# Patient Record
Sex: Female | Born: 1980
Health system: Southern US, Community
[De-identification: ages and names within clinical notes are randomized; demographics above are authoritative.]

## PROBLEM LIST (undated history)

## (undated) DIAGNOSIS — IMO0002 Reserved for concepts with insufficient information to code with codable children: Secondary | ICD-10-CM

## (undated) DIAGNOSIS — R51 Headache: Secondary | ICD-10-CM

## (undated) DIAGNOSIS — Z8619 Personal history of other infectious and parasitic diseases: Secondary | ICD-10-CM

## (undated) DIAGNOSIS — R519 Headache, unspecified: Secondary | ICD-10-CM

## (undated) DIAGNOSIS — K4090 Unilateral inguinal hernia, without obstruction or gangrene, not specified as recurrent: Secondary | ICD-10-CM

## (undated) DIAGNOSIS — N390 Urinary tract infection, site not specified: Secondary | ICD-10-CM

## (undated) HISTORY — DX: Headache, unspecified: R51.9

## (undated) HISTORY — PX: CERVICAL FUSION: SHX112

## (undated) HISTORY — DX: Personal history of other infectious and parasitic diseases: Z86.19

## (undated) HISTORY — DX: Headache: R51

---
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
## 2001-01-19 ENCOUNTER — Other Ambulatory Visit: Admission: RE | Admit: 2001-01-19 | Discharge: 2001-01-19 | Payer: Self-pay | Admitting: Otolaryngology

## 2001-05-20 ENCOUNTER — Other Ambulatory Visit: Admission: RE | Admit: 2001-05-20 | Discharge: 2001-05-20 | Payer: Self-pay | Admitting: *Deleted

## 2001-06-23 ENCOUNTER — Emergency Department (HOSPITAL_COMMUNITY): Admission: EM | Admit: 2001-06-23 | Discharge: 2001-06-23 | Payer: Self-pay | Admitting: Emergency Medicine

## 2002-12-07 ENCOUNTER — Other Ambulatory Visit: Admission: RE | Admit: 2002-12-07 | Discharge: 2002-12-07 | Payer: Self-pay | Admitting: *Deleted

## 2003-04-07 ENCOUNTER — Inpatient Hospital Stay (HOSPITAL_COMMUNITY): Admission: AD | Admit: 2003-04-07 | Discharge: 2003-04-07 | Payer: Self-pay | Admitting: Obstetrics and Gynecology

## 2003-06-14 ENCOUNTER — Ambulatory Visit (HOSPITAL_COMMUNITY): Admission: RE | Admit: 2003-06-14 | Discharge: 2003-06-14 | Payer: Self-pay | Admitting: Obstetrics and Gynecology

## 2003-06-29 ENCOUNTER — Inpatient Hospital Stay (HOSPITAL_COMMUNITY): Admission: AD | Admit: 2003-06-29 | Discharge: 2003-06-30 | Payer: Self-pay | Admitting: Obstetrics and Gynecology

## 2003-07-13 ENCOUNTER — Inpatient Hospital Stay (HOSPITAL_COMMUNITY): Admission: AD | Admit: 2003-07-13 | Discharge: 2003-07-15 | Payer: Self-pay | Admitting: Obstetrics and Gynecology

## 2004-10-30 ENCOUNTER — Ambulatory Visit (HOSPITAL_COMMUNITY): Admission: RE | Admit: 2004-10-30 | Discharge: 2004-10-30 | Payer: Self-pay | Admitting: *Deleted

## 2005-06-26 ENCOUNTER — Other Ambulatory Visit: Admission: RE | Admit: 2005-06-26 | Discharge: 2005-06-26 | Payer: Self-pay | Admitting: Obstetrics and Gynecology

## 2008-04-14 HISTORY — PX: OTHER SURGICAL HISTORY: SHX169

## 2009-12-21 ENCOUNTER — Encounter: Admission: RE | Admit: 2009-12-21 | Discharge: 2009-12-21 | Payer: Self-pay | Admitting: *Deleted

## 2010-04-22 ENCOUNTER — Encounter
Admission: RE | Admit: 2010-04-22 | Discharge: 2010-04-22 | Payer: Self-pay | Source: Home / Self Care | Attending: Otolaryngology | Admitting: Otolaryngology

## 2010-08-30 NOTE — Op Note (Signed)
Ruth Blevins, Ruth Blevins            ACCOUNT NO.:  [id number]   MEDICAL RECORD NO.:  [id number]          PATIENT TYPE:  OIB   LOCATION:  NA                           FACILITY:  [facility]   PHYSICIAN:  Vikki Ports, MDDATE OF BIRTH:  [birth year]   DATE OF PROCEDURE:  09/30/2004  DATE OF DISCHARGE:                                 OPERATIVE REPORT   PREOPERATIVE DIAGNOSIS:  Right inguinal hernia.   POSTOPERATIVE DIAGNOSIS:  Right inguinal hernia.   PROCEDURE:  Laparoscopic converted to open right inguinal hernia repair with  mesh.   SURGEON:  Vikki Ports, M.D.   ANESTHESIA:  General.   DESCRIPTION OF PROCEDURE:  The patient was taken to the operating room and  placed in the supine position.  After adequate general anesthesia was  induced using the endotracheal tube, the abdomen was prepped and draped in a  normal sterile fashion.  Using a transverse infra-umbilical incision, I  dissected down onto the right rectus fascia.  A small transverse incision  was made in the anterior fascia.  The muscle fibers were retracted  laterally.  A Kelly clamp was then placed in the pre-peritoneal space down  towards the pubic tubercle.  The dissecting balloon was placed in this space  and inflated.  Of note, the balloon appeared to deploy more on the left than  on the right.  It appeared that there may have been a tear in the peritoneum  during the balloon dissection.  This balloon was removed and the operating  balloon was placed.  CO2 was insufflated.  I was getting obvious  pneumoperitoneum, verifying the presence of a tear in the peritoneum, and I  opted to open.  The anterior fascia was closed with a figure-of-eight Vicryl  suture and the skin was closed with staples.   I then made an oblique incision in the right inguinal area, dissected down  the external oblique fascia which was opened along with its fibers down to  the external ring.  The round ligament was identified and an  indirect hernia  sac was identified.  This was taken up to the internal ring and ligated and  sent for pathologic evaluation.  There was no direct defect; however, a  piece of Prolene mesh was placed over the floor of Hesselbach's triangle,  covering both the external and internal rings, and tacked in place using a  running #2-0 Prolene suture.  The external oblique fascia was then closed  with a running #3-0 Vicryl suture.  The tissues were injected with Marcaine.  The skin was closed with subcuticular #3-0 Monocryl.  Steri-Strips and a  sterile dressing were applied.   The patient tolerated the procedure well and went to the PACU in good  condition.       KRH/MEDQ  D:  10/30/2004  T:  10/30/2004  Job:  [id number]

## 2012-04-01 LAB — OB RESULTS CONSOLE HEPATITIS B SURFACE ANTIGEN: Hepatitis B Surface Ag: NEGATIVE

## 2012-04-01 LAB — OB RESULTS CONSOLE GC/CHLAMYDIA
Chlamydia: NEGATIVE
Gonorrhea: NEGATIVE

## 2012-04-01 LAB — OB RESULTS CONSOLE ABO/RH: RH Type: POSITIVE

## 2012-04-01 LAB — OB RESULTS CONSOLE ANTIBODY SCREEN: Antibody Screen: NEGATIVE

## 2012-04-01 LAB — OB RESULTS CONSOLE RUBELLA ANTIBODY, IGM: Rubella: IMMUNE

## 2012-04-01 LAB — OB RESULTS CONSOLE RPR: RPR: NONREACTIVE

## 2012-04-01 LAB — OB RESULTS CONSOLE HIV ANTIBODY (ROUTINE TESTING): HIV: NONREACTIVE

## 2012-04-14 NOTE — L&D Delivery Note (Signed)
Delivery Note Pt initially progressed quickly after SROM, then required pitocin augmentation.  She then progressed to complete and pushed well.  At 3:04 AM a viable female was delivered via Vaginal, Spontaneous Delivery (Presentation: ; Occiput Posterior).  APGAR: 9, 9; weight pending.   Placenta status: Intact, Spontaneous.  Cord: 3 vessels with the following complications: None.  Anesthesia: Epidural Local, epidural Episiotomy: None Lacerations: 1st degree;Perineal Suture Repair: 3.0 vicryl rapide Est. Blood Loss (mL): 300  Mom to postpartum.  Baby to stay with mom.  Discussed circumcision procedure and risks, consent signed.    Graeden Bitner D 10/10/2012, 3:18 PM

## 2012-09-22 LAB — OB RESULTS CONSOLE GBS: GBS: NEGATIVE

## 2012-10-09 ENCOUNTER — Encounter (HOSPITAL_COMMUNITY): Payer: Self-pay

## 2012-10-09 ENCOUNTER — Inpatient Hospital Stay (HOSPITAL_COMMUNITY)
Admission: AD | Admit: 2012-10-09 | Discharge: 2012-10-11 | DRG: 373 | Disposition: A | Discharge status: Home / Self Care | Payer: BC Managed Care – PPO | Source: Ambulatory Visit | Attending: Obstetrics and Gynecology | Admitting: Obstetrics and Gynecology

## 2012-10-09 DIAGNOSIS — Z3490 Encounter for supervision of normal pregnancy, unspecified, unspecified trimester: Secondary | ICD-10-CM

## 2012-10-09 HISTORY — DX: Urinary tract infection, site not specified: N39.0

## 2012-10-09 HISTORY — DX: Reserved for concepts with insufficient information to code with codable children: IMO0002

## 2012-10-09 HISTORY — DX: Unilateral inguinal hernia, without obstruction or gangrene, not specified as recurrent: K40.90

## 2012-10-09 NOTE — MAU Note (Signed)
Patient is in with c/o ctx q34m for a few hours, was 1-2cm on Wednesday. Denies vaginal bleeding or lof. She reports good fetal movement.

## 2012-10-09 NOTE — Progress Notes (Signed)
Dr Jackelyn Knife notified of patient presenting c/o of contractions q73m, tracing, ctx pattern, history of delivery at 37 weeks with first child (9 years ago) and sve result. Order that it is okay for patient to ambulate for an hour, recheck and if no cervical change. Patient may be discharged home. Patient may have one tablet of 5mg  ambien before discharge, if she desires.

## 2012-10-10 ENCOUNTER — Encounter (HOSPITAL_COMMUNITY): Payer: Self-pay | Admitting: Obstetrics

## 2012-10-10 ENCOUNTER — Encounter (HOSPITAL_COMMUNITY): Payer: Self-pay | Admitting: Anesthesiology

## 2012-10-10 ENCOUNTER — Inpatient Hospital Stay (HOSPITAL_COMMUNITY): Payer: BC Managed Care – PPO | Admitting: Anesthesiology

## 2012-10-10 DIAGNOSIS — Z3490 Encounter for supervision of normal pregnancy, unspecified, unspecified trimester: Secondary | ICD-10-CM

## 2012-10-10 LAB — CBC
HCT: 31.3 % — ABNORMAL LOW (ref 36.0–46.0)
HCT: 26.5 % — ABNORMAL LOW (ref 36.0–46.0)
Hemoglobin: 10.4 g/dL — ABNORMAL LOW (ref 12.0–15.0)
Hemoglobin: 8.9 g/dL — ABNORMAL LOW (ref 12.0–15.0)
MCH: 27.9 pg (ref 26.0–34.0)
MCH: 28.2 pg (ref 26.0–34.0)
MCHC: 33.2 g/dL (ref 30.0–36.0)
MCHC: 33.6 g/dL (ref 30.0–36.0)
MCV: 83.9 fL (ref 78.0–100.0)
MCV: 83.9 fL (ref 78.0–100.0)
Platelets: 126 10*3/uL — ABNORMAL LOW (ref 150–400)
Platelets: 90 10*3/uL — ABNORMAL LOW (ref 150–400)
RBC: 3.73 MIL/uL — ABNORMAL LOW (ref 3.87–5.11)
RBC: 3.16 MIL/uL — ABNORMAL LOW (ref 3.87–5.11)
RDW: 13.5 % (ref 11.5–15.5)
RDW: 13.5 % (ref 11.5–15.5)
WBC: 16.7 10*3/uL — ABNORMAL HIGH (ref 4.0–10.5)
WBC: 18.5 10*3/uL — ABNORMAL HIGH (ref 4.0–10.5)

## 2012-10-10 LAB — RPR: RPR Ser Ql: NONREACTIVE

## 2012-10-10 MED ORDER — BUTORPHANOL TARTRATE 1 MG/ML IJ SOLN
1.0000 mg | Freq: Once | INTRAMUSCULAR | Status: AC
  Administered 2012-10-10: 1 mg via INTRAMUSCULAR
  Filled 2012-10-10: qty 1

## 2012-10-10 MED ORDER — SENNOSIDES-DOCUSATE SODIUM 8.6-50 MG PO TABS
2.0000 | ORAL_TABLET | Freq: Every day | ORAL | Status: DC
  Administered 2012-10-10: 2 via ORAL

## 2012-10-10 MED ORDER — FENTANYL 2.5 MCG/ML BUPIVACAINE 1/10 % EPIDURAL INFUSION (WH - ANES)
14.0000 mL/h | INTRAMUSCULAR | Status: DC | PRN
  Administered 2012-10-10: 14 mL/h via EPIDURAL
  Filled 2012-10-10 (×2): qty 125

## 2012-10-10 MED ORDER — LACTATED RINGERS IV SOLN
500.0000 mL | Freq: Once | INTRAVENOUS | Status: AC
  Administered 2012-10-10: 500 mL via INTRAVENOUS

## 2012-10-10 MED ORDER — ZOLPIDEM TARTRATE 5 MG PO TABS: 5.0000 mg | ORAL_TABLET | Freq: Every evening | ORAL | Status: DC | PRN

## 2012-10-10 MED ORDER — PHENYLEPHRINE 40 MCG/ML (10ML) SYRINGE FOR IV PUSH (FOR BLOOD PRESSURE SUPPORT): 80.0000 ug | PREFILLED_SYRINGE | INTRAVENOUS | Status: DC | PRN

## 2012-10-10 MED ORDER — LIDOCAINE HCL (PF) 1 % IJ SOLN
INTRAMUSCULAR | Status: DC | PRN
  Administered 2012-10-10: 3 mL
  Administered 2012-10-10: 4 mL

## 2012-10-10 MED ORDER — IBUPROFEN 600 MG PO TABS
600.0000 mg | ORAL_TABLET | Freq: Four times a day (QID) | ORAL | Status: DC | PRN
  Filled 2012-10-10: qty 1

## 2012-10-10 MED ORDER — WITCH HAZEL-GLYCERIN EX PADS: 1.0000 "application " | MEDICATED_PAD | CUTANEOUS | Status: DC | PRN

## 2012-10-10 MED ORDER — DIPHENHYDRAMINE HCL 25 MG PO CAPS: 25.0000 mg | ORAL_CAPSULE | Freq: Four times a day (QID) | ORAL | Status: DC | PRN

## 2012-10-10 MED ORDER — METHYLERGONOVINE MALEATE 0.2 MG PO TABS: 0.2000 mg | ORAL_TABLET | ORAL | Status: DC | PRN

## 2012-10-10 MED ORDER — MAGNESIUM HYDROXIDE 400 MG/5ML PO SUSP: 30.0000 mL | ORAL | Status: DC | PRN

## 2012-10-10 MED ORDER — OXYTOCIN BOLUS FROM INFUSION
500.0000 mL | INTRAVENOUS | Status: DC
  Administered 2012-10-10: 500 mL via INTRAVENOUS

## 2012-10-10 MED ORDER — OXYTOCIN 40 UNITS IN LACTATED RINGERS INFUSION - SIMPLE MED
62.5000 mL/h | INTRAVENOUS | Status: DC
  Filled 2012-10-10: qty 1000

## 2012-10-10 MED ORDER — ONDANSETRON HCL 4 MG PO TABS: 4.0000 mg | ORAL_TABLET | ORAL | Status: DC | PRN

## 2012-10-10 MED ORDER — FLEET ENEMA 7-19 GM/118ML RE ENEM: 1.0000 | ENEMA | RECTAL | Status: DC | PRN

## 2012-10-10 MED ORDER — CITRIC ACID-SODIUM CITRATE 334-500 MG/5ML PO SOLN: 30.0000 mL | ORAL | Status: DC | PRN

## 2012-10-10 MED ORDER — PHENYLEPHRINE 40 MCG/ML (10ML) SYRINGE FOR IV PUSH (FOR BLOOD PRESSURE SUPPORT)
80.0000 ug | PREFILLED_SYRINGE | INTRAVENOUS | Status: DC | PRN
  Filled 2012-10-10: qty 5

## 2012-10-10 MED ORDER — EPHEDRINE 5 MG/ML INJ
10.0000 mg | INTRAVENOUS | Status: DC | PRN
  Filled 2012-10-10: qty 4

## 2012-10-10 MED ORDER — MEASLES, MUMPS & RUBELLA VAC ~~LOC~~ INJ: 0.5000 mL | INJECTION | Freq: Once | SUBCUTANEOUS | Status: DC

## 2012-10-10 MED ORDER — EPHEDRINE 5 MG/ML INJ: 10.0000 mg | INTRAVENOUS | Status: DC | PRN

## 2012-10-10 MED ORDER — IBUPROFEN 600 MG PO TABS
600.0000 mg | ORAL_TABLET | Freq: Four times a day (QID) | ORAL | Status: DC
  Administered 2012-10-10 – 2012-10-11 (×4): 600 mg via ORAL
  Filled 2012-10-10 (×4): qty 1

## 2012-10-10 MED ORDER — LACTATED RINGERS IV SOLN
INTRAVENOUS | Status: DC
  Administered 2012-10-10: 07:00:00 via INTRAVENOUS

## 2012-10-10 MED ORDER — ONDANSETRON HCL 4 MG/2ML IJ SOLN: 4.0000 mg | Freq: Four times a day (QID) | INTRAMUSCULAR | Status: DC | PRN

## 2012-10-10 MED ORDER — DIPHENHYDRAMINE HCL 50 MG/ML IJ SOLN: 12.5000 mg | INTRAMUSCULAR | Status: DC | PRN

## 2012-10-10 MED ORDER — OXYCODONE-ACETAMINOPHEN 5-325 MG PO TABS: 1.0000 | ORAL_TABLET | ORAL | Status: DC | PRN

## 2012-10-10 MED ORDER — METHYLERGONOVINE MALEATE 0.2 MG/ML IJ SOLN: 0.2000 mg | INTRAMUSCULAR | Status: DC | PRN

## 2012-10-10 MED ORDER — TERBUTALINE SULFATE 1 MG/ML IJ SOLN: 0.2500 mg | Freq: Once | INTRAMUSCULAR | Status: DC | PRN

## 2012-10-10 MED ORDER — LIDOCAINE HCL (PF) 1 % IJ SOLN
30.0000 mL | INTRAMUSCULAR | Status: DC | PRN
  Administered 2012-10-10: 30 mL via SUBCUTANEOUS
  Filled 2012-10-10: qty 30

## 2012-10-10 MED ORDER — ONDANSETRON HCL 4 MG/2ML IJ SOLN: 4.0000 mg | INTRAMUSCULAR | Status: DC | PRN

## 2012-10-10 MED ORDER — SIMETHICONE 80 MG PO CHEW: 80.0000 mg | CHEWABLE_TABLET | ORAL | Status: DC | PRN

## 2012-10-10 MED ORDER — DIBUCAINE 1 % RE OINT
1.0000 "application " | TOPICAL_OINTMENT | RECTAL | Status: DC | PRN
  Filled 2012-10-10: qty 28

## 2012-10-10 MED ORDER — ALUM & MAG HYDROXIDE-SIMETH 200-200-20 MG/5ML PO SUSP: 30.0000 mL | Freq: Once | ORAL | Status: DC

## 2012-10-10 MED ORDER — PRENATAL MULTIVITAMIN CH: 1.0000 | ORAL_TABLET | Freq: Every day | ORAL | Status: DC

## 2012-10-10 MED ORDER — BENZOCAINE-MENTHOL 20-0.5 % EX AERO
1.0000 "application " | INHALATION_SPRAY | CUTANEOUS | Status: DC | PRN
  Filled 2012-10-10 (×2): qty 56

## 2012-10-10 MED ORDER — LANOLIN HYDROUS EX OINT: TOPICAL_OINTMENT | CUTANEOUS | Status: DC | PRN

## 2012-10-10 MED ORDER — ACETAMINOPHEN 325 MG PO TABS: 650.0000 mg | ORAL_TABLET | ORAL | Status: DC | PRN

## 2012-10-10 MED ORDER — OXYCODONE-ACETAMINOPHEN 5-325 MG PO TABS
1.0000 | ORAL_TABLET | ORAL | Status: DC | PRN
  Administered 2012-10-10 – 2012-10-11 (×3): 1 via ORAL
  Filled 2012-10-10 (×3): qty 1

## 2012-10-10 MED ORDER — TETANUS-DIPHTH-ACELL PERTUSSIS 5-2.5-18.5 LF-MCG/0.5 IM SUSP
0.5000 mL | Freq: Once | INTRAMUSCULAR | Status: DC
  Filled 2012-10-10: qty 0.5

## 2012-10-10 MED ORDER — FENTANYL 2.5 MCG/ML BUPIVACAINE 1/10 % EPIDURAL INFUSION (WH - ANES)
INTRAMUSCULAR | Status: DC | PRN
  Administered 2012-10-10: 14 mL/h via EPIDURAL

## 2012-10-10 MED ORDER — OXYTOCIN 40 UNITS IN LACTATED RINGERS INFUSION - SIMPLE MED
1.0000 m[IU]/min | INTRAVENOUS | Status: DC
  Administered 2012-10-10: 1 m[IU]/min via INTRAVENOUS
  Filled 2012-10-10: qty 1000

## 2012-10-10 MED ORDER — LACTATED RINGERS IV SOLN: 500.0000 mL | INTRAVENOUS | Status: DC | PRN

## 2012-10-10 NOTE — H&P (Signed)
Ruth Blevins is a 32 y.o. female, G3 P1011, EGA 37+ weeks with EDC 7-17 presenting for eval of ctx.  Over several hours in MAU with reg ctx, VE changed from 2 to 4 cm dilated.  Pt admitted and has received an epidural.  Prenatal care complicated by preterm ctx and dilation with first pregnancy, so received weekly 17-P with this pregnancy with Dr. Senaida Ores as primary doxctor.  No other significant complications, see prenatal records for complete history.  Maternal Medical History:  Reason for admission: Contractions.   Contractions: Frequency: regular.   Perceived severity is strong.    Fetal activity: Perceived fetal activity is normal.      OB History   Grav Para Term Preterm Abortions TAB SAB Ect Mult Living   3 1 1  1  1   1     SVD at 37 weeks, preterm ctx at 26 weeks  Past Medical History  Diagnosis Date  . Inguinal hernia   . UTI (lower urinary tract infection)   . Degenerative disc disease    Past Surgical History  Procedure Laterality Date  . Cervical fusion     Family History: family history is not on file. Social History:  reports that she has never smoked. She does not have any smokeless tobacco history on file. She reports that she does not drink alcohol or use illicit drugs.   Prenatal Transfer Tool  Maternal Diabetes: No Genetic Screening: Normal Maternal Ultrasounds/Referrals: Normal Fetal Ultrasounds or other Referrals:  None Maternal Substance Abuse:  No Significant Maternal Medications:  Meds include: Progesterone Significant Maternal Lab Results:  Lab values include: Group B Strep negative Other Comments:  None  Review of Systems  Respiratory: Negative.   Cardiovascular: Negative.    SROM during exam Dilation: 4 Effacement (%): 80 Station: -2 Exam by:: dr Jackelyn Knife Blood pressure 82/64, pulse 125, temperature 98.1 F (36.7 C), temperature source Oral, resp. rate 18, height 5\' 4"  (1.626 m), weight 73.483 kg (162 lb), SpO2 100.00%. Maternal  Exam:  Uterine Assessment: Contraction strength is moderate.  Contraction frequency is regular.   Abdomen: Patient reports no abdominal tenderness. Estimated fetal weight is 7 lbs.   Fetal presentation: vertex  Introitus: Normal vulva. Normal vagina.  Amniotic fluid character: clear.  Pelvis: adequate for delivery.   Cervix: Cervix evaluated by digital exam.     Fetal Exam Fetal Monitor Review: Mode: ultrasound.   Baseline rate: 130.  Variability: moderate (6-25 bpm).   Pattern: accelerations present and no decelerations.    Fetal State Assessment: Category I - tracings are normal.     Physical Exam  Constitutional: She appears well-developed and well-nourished.  Cardiovascular: Normal rate, regular rhythm and normal heart sounds.   No murmur heard. Respiratory: Effort normal and breath sounds normal. No respiratory distress.  GI: Soft.  Gravid     Prenatal labs: ABO, Rh:  A pos Antibody:  neg Rubella:  Imm RPR:   NR HBsAg:  Neg  HIV:   Neg GBS:   Neg GCT:  108  Assessment/Plan: IUP at 37+ weeks in latent labor, had SROM during my exam.  Will monitor progress, anticipate SVD.   Fabiola Mudgett D 10/10/2012, 8:42 AM

## 2012-10-10 NOTE — Progress Notes (Signed)
Dr Jackelyn Knife notified of patient, becoming very uncomfortable with each contraction, cervical change, tracing and ctx pattern. Order to continue efm and recheck cervix. Administer 1mg  stadol im. Call back with update.

## 2012-10-10 NOTE — Progress Notes (Signed)
Dr Jackelyn Knife of current cervical exam. Will admit patient. Patient may have epidural.

## 2012-10-10 NOTE — Anesthesia Preprocedure Evaluation (Signed)
Anesthesia Evaluation  Patient identified by MRN, date of birth, ID band Patient awake    Reviewed: Allergy & Precautions, H&P , NPO status , Patient's Chart, lab work & pertinent test results  Airway Mallampati: II TM Distance: >3 FB Neck ROM: Limited    Dental  (+) Dental Advisory Given   Pulmonary neg pulmonary ROS,  breath sounds clear to auscultation        Cardiovascular negative cardio ROS  Rhythm:Regular     Neuro/Psych negative neurological ROS  negative psych ROS   GI/Hepatic negative GI ROS, Neg liver ROS,   Endo/Other  negative endocrine ROS  Renal/GU negative Renal ROS     Musculoskeletal negative musculoskeletal ROS (+)   Abdominal   Peds  Hematology negative hematology ROS (+)   Anesthesia Other Findings   Reproductive/Obstetrics (+) Pregnancy                           Anesthesia Physical Anesthesia Plan  ASA: II  Anesthesia Plan: Epidural   Post-op Pain Management:    Induction:   Airway Management Planned:   Additional Equipment:   Intra-op Plan:   Post-operative Plan:   Informed Consent: I have reviewed the patients History and Physical, chart, labs and discussed the procedure including the risks, benefits and alternatives for the proposed anesthesia with the patient or authorized representative who has indicated his/her understanding and acceptance.     Plan Discussed with:   Anesthesia Plan Comments:         Anesthesia Quick Evaluation

## 2012-10-10 NOTE — Progress Notes (Signed)
Pt states she feels that her water broke. No fluid visible on examination. Fern slide negative.

## 2012-10-10 NOTE — Anesthesia Procedure Notes (Signed)
Epidural Patient location during procedure: OB Start time: 10/10/2012 4:00 AM End time: 10/10/2012 4:15 AM  Staffing Anesthesiologist: Lewie Loron R Performed by: anesthesiologist   Preanesthetic Checklist Completed: patient identified, pre-op evaluation, timeout performed, IV checked, risks and benefits discussed and monitors and equipment checked  Epidural Patient position: sitting Prep: site prepped and draped and DuraPrep Patient monitoring: heart rate, continuous pulse ox and blood pressure Approach: midline Injection technique: LOR air and LOR saline  Needle:  Needle type: Tuohy  Needle gauge: 17 G Needle length: 9 cm Needle insertion depth: 6 cm Catheter type: closed end flexible Catheter size: 19 Gauge Catheter at skin depth: 12 cm Test dose: negative  Assessment Sensory level: T8 Events: blood not aspirated, injection not painful, no injection resistance, negative IV test and no paresthesia  Additional Notes Reason for block:procedure for pain

## 2012-10-11 LAB — CBC
HCT: 27.3 % — ABNORMAL LOW (ref 36.0–46.0)
Hemoglobin: 8.8 g/dL — ABNORMAL LOW (ref 12.0–15.0)
MCH: 27.2 pg (ref 26.0–34.0)
MCHC: 32.2 g/dL (ref 30.0–36.0)
MCV: 84.3 fL (ref 78.0–100.0)
Platelets: 110 10*3/uL — ABNORMAL LOW (ref 150–400)
RBC: 3.24 MIL/uL — ABNORMAL LOW (ref 3.87–5.11)
RDW: 13.6 % (ref 11.5–15.5)
WBC: 19.1 10*3/uL — ABNORMAL HIGH (ref 4.0–10.5)

## 2012-10-11 NOTE — Anesthesia Postprocedure Evaluation (Signed)
  Anesthesia Post-op Note  Patient: Ruth Blevins  Procedure(s) Performed: * No procedures listed *  Patient Location: PACU and Mother/Baby  Anesthesia Type:Epidural  Level of Consciousness: awake  Airway and Oxygen Therapy: Patient Spontanous Breathing  Post-op Pain: none  Post-op Assessment: Patient's Cardiovascular Status Stable, Respiratory Function Stable, Patent Airway, No signs of Nausea or vomiting, Adequate PO intake, Pain level controlled, No headache, No backache, No residual numbness and No residual motor weakness  Post-op Vital Signs: Reviewed and stable  Complications: No apparent anesthesia complications

## 2012-10-11 NOTE — Discharge Instructions (Signed)
As per discharge pamphlet °

## 2012-10-11 NOTE — Progress Notes (Signed)
PPD #1 No problems, wants to go home Afeb, VSS Fundus firm, NT at U-1 Continue routine postpartum care, will d/c home this pm if baby able to go

## 2012-10-11 NOTE — Discharge Summary (Signed)
Obstetric Discharge Summary Reason for Admission: onset of labor Prenatal Procedures: none Intrapartum Procedures: spontaneous vaginal delivery Postpartum Procedures: none Complications-Operative and Postpartum: 1st degree perineal laceration Hemoglobin  Date Value Range Status  10/10/2012 8.8* 12.0 - 15.0 g/dL Final     HCT  Date Value Range Status  10/10/2012 27.3* 36.0 - 46.0 % Final    Physical Exam:  General: alert Lochia: appropriate Uterine Fundus: firm  Discharge Diagnoses: Term Pregnancy-delivered  Discharge Information: Date: 10/11/2012 Activity: pelvic rest Diet: routine Medications: Ibuprofen Condition: stable Instructions: refer to practice specific booklet Discharge to: home Follow-up Information   Follow up with Dorsie Burich D, MD. Schedule an appointment as soon as possible for a visit in 6 weeks.   Contact information:   [address] [place] [address] [phone]       Newborn Data: Live born female  Birth Weight: 6 lb 5.4 oz (2875 g) APGAR: 9, 9  Home with mother.  Pranika Finks D 10/11/2012, 9:07 AM

## 2012-10-21 ENCOUNTER — Inpatient Hospital Stay (HOSPITAL_COMMUNITY): Admission: RE | Admit: 2012-10-21 | Payer: BC Managed Care – PPO | Source: Ambulatory Visit

## 2014-02-13 ENCOUNTER — Encounter (HOSPITAL_COMMUNITY): Payer: Self-pay | Admitting: Obstetrics

## 2016-02-24 DIAGNOSIS — M542 Cervicalgia: Secondary | ICD-10-CM | POA: Insufficient documentation

## 2016-02-24 DIAGNOSIS — G894 Chronic pain syndrome: Secondary | ICD-10-CM | POA: Insufficient documentation

## 2016-02-24 DIAGNOSIS — M7918 Myalgia, other site: Secondary | ICD-10-CM | POA: Insufficient documentation

## 2016-02-24 DIAGNOSIS — M961 Postlaminectomy syndrome, not elsewhere classified: Secondary | ICD-10-CM | POA: Insufficient documentation

## 2016-11-26 ENCOUNTER — Ambulatory Visit (INDEPENDENT_AMBULATORY_CARE_PROVIDER_SITE_OTHER): Payer: BLUE CROSS/BLUE SHIELD | Admitting: Family Medicine

## 2016-11-26 ENCOUNTER — Encounter: Payer: Self-pay | Admitting: Family Medicine

## 2016-11-26 VITALS — BP 100/60 | HR 73 | Temp 99.2°F | Ht 64.75 in | Wt 157.5 lb

## 2016-11-26 DIAGNOSIS — F419 Anxiety disorder, unspecified: Secondary | ICD-10-CM | POA: Diagnosis not present

## 2016-11-26 DIAGNOSIS — G8929 Other chronic pain: Secondary | ICD-10-CM

## 2016-11-26 DIAGNOSIS — F5104 Psychophysiologic insomnia: Secondary | ICD-10-CM

## 2016-11-26 DIAGNOSIS — M545 Low back pain, unspecified: Secondary | ICD-10-CM

## 2016-11-26 DIAGNOSIS — F329 Major depressive disorder, single episode, unspecified: Secondary | ICD-10-CM | POA: Diagnosis not present

## 2016-11-26 DIAGNOSIS — F32A Depression, unspecified: Secondary | ICD-10-CM

## 2016-11-26 MED ORDER — ESCITALOPRAM OXALATE 10 MG PO TABS: 10.0000 mg | ORAL_TABLET | Freq: Every day | ORAL | 5 refills | Status: DC

## 2016-11-26 NOTE — Patient Instructions (Signed)
Please follow up in 6-8 weeks If you are feeling worse or having more symptoms please let me know  Generalized Anxiety Disorder, Adult Generalized anxiety disorder (GAD) is a mental health disorder. People with this condition constantly worry about everyday events. Unlike normal anxiety, worry related to GAD is not triggered by a specific event. These worries also do not fade or get better with time. GAD interferes with life functions, including relationships, work, and school. GAD can vary from mild to severe. People with severe GAD can have intense waves of anxiety with physical symptoms (panic attacks). What are the causes? The exact cause of GAD is not known. What increases the risk? This condition is more likely to develop in:  Women.  People who have a family history of anxiety disorders.  People who are very shy.  People who experience very stressful life events, such as the death of a loved one.  People who have a very stressful family environment.  What are the signs or symptoms? People with GAD often worry excessively about many things in their lives, such as their health and family. They may also be overly concerned about:  Doing well at work.  Being on time.  Natural disasters.  Friendships.  Physical symptoms of GAD include:  Fatigue.  Muscle tension or having muscle twitches.  Trembling or feeling shaky.  Being easily startled.  Feeling like your heart is pounding or racing.  Feeling out of breath or like you cannot take a deep breath.  Having trouble falling asleep or staying asleep.  Sweating.  Nausea, diarrhea, or irritable bowel syndrome (IBS).  Headaches.  Trouble concentrating or remembering facts.  Restlessness.  Irritability.  How is this diagnosed? Your health care provider can diagnose GAD based on your symptoms and medical history. You will also have a physical exam. The health care provider will ask specific questions about your  symptoms, including how severe they are, when they started, and if they come and go. Your health care provider may ask you about your use of alcohol or drugs, including prescription medicines. Your health care provider may refer you to a mental health specialist for further evaluation. Your health care provider will do a thorough examination and may perform additional tests to rule out other possible causes of your symptoms. To be diagnosed with GAD, a person must have anxiety that:  Is out of his or her control.  Affects several different aspects of his or her life, such as work and relationships.  Causes distress that makes him or her unable to take part in normal activities.  Includes at least three physical symptoms of GAD, such as restlessness, fatigue, trouble concentrating, irritability, muscle tension, or sleep problems.  Before your health care provider can confirm a diagnosis of GAD, these symptoms must be present more days than they are not, and they must last for six months or longer. How is this treated? The following therapies are usually used to treat GAD:  Medicine. Antidepressant medicine is usually prescribed for long-term daily control. Antianxiety medicines may be added in severe cases, especially when panic attacks occur.  Talk therapy (psychotherapy). Certain types of talk therapy can be helpful in treating GAD by providing support, education, and guidance. Options include: ? Cognitive behavioral therapy (CBT). People learn coping skills and techniques to ease their anxiety. They learn to identify unrealistic or negative thoughts and behaviors and to replace them with positive ones. ? Acceptance and commitment therapy (ACT). This treatment teaches people how  to be mindful as a way to cope with unwanted thoughts and feelings. ? Biofeedback. This process trains you to manage your body's response (physiological response) through breathing techniques and relaxation methods. You  will work with a therapist while machines are used to monitor your physical symptoms.  Stress management techniques. These include yoga, meditation, and exercise.  A mental health specialist can help determine which treatment is best for you. Some people see improvement with one type of therapy. However, other people require a combination of therapies. Follow these instructions at home:  Take over-the-counter and prescription medicines only as told by your health care provider.  Try to maintain a normal routine.  Try to anticipate stressful situations and allow extra time to manage them.  Practice any stress management or self-calming techniques as taught by your health care provider.  Do not punish yourself for setbacks or for not making progress.  Try to recognize your accomplishments, even if they are small.  Keep all follow-up visits as told by your health care provider. This is important. Contact a health care provider if:  Your symptoms do not get better.  Your symptoms get worse.  You have signs of depression, such as: ? A persistently sad, cranky, or irritable mood. ? Loss of enjoyment in activities that used to bring you joy. ? Change in weight or eating. ? Changes in sleeping habits. ? Avoiding friends or family members. ? Loss of energy for normal tasks. ? Feelings of guilt or worthlessness. Get help right away if:  You have serious thoughts about hurting yourself or others. If you ever feel like you may hurt yourself or others, or have thoughts about taking your own life, get help right away. You can go to your nearest emergency department or call:  Your local emergency services (911 in the [place]  A suicide crisis helpline, such as the National Suicide Prevention Lifeline at 707-158-0866. This is open 24 hours a day.  Summary  Generalized anxiety disorder (GAD) is a mental health disorder that involves worry that is not triggered by a specific  event.  People with GAD often worry excessively about many things in their lives, such as their health and family.  GAD may cause physical symptoms such as restlessness, trouble concentrating, sleep problems, frequent sweating, nausea, diarrhea, headaches, and trembling or muscle twitching.  A mental health specialist can help determine which treatment is best for you. Some people see improvement with one type of therapy. However, other people require a combination of therapies. This information is not intended to replace advice given to you by your health care provider. Make sure you discuss any questions you have with your health care provider. Document Released: 07/26/2012 Document Revised: 02/19/2016 Document Reviewed: 02/19/2016 Elsevier Interactive Patient Education  [employer].
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
## 2016-11-27 LAB — COMPREHENSIVE METABOLIC PANEL WITH GFR
ALT: 15 U/L (ref 0–35)
AST: 18 U/L (ref 0–37)
Albumin: 4.3 g/dL (ref 3.5–5.2)
Alkaline Phosphatase: 41 U/L (ref 39–117)
BUN: 14 mg/dL (ref 6–23)
CO2: 27 meq/L (ref 19–32)
Calcium: 9.3 mg/dL (ref 8.4–10.5)
Chloride: 105 meq/L (ref 96–112)
Creatinine, Ser: 0.67 mg/dL (ref 0.40–1.20)
GFR: 106.64 mL/min
Glucose, Bld: 87 mg/dL (ref 70–99)
Potassium: 4.1 meq/L (ref 3.5–5.1)
Sodium: 139 meq/L (ref 135–145)
Total Bilirubin: 0.3 mg/dL (ref 0.2–1.2)
Total Protein: 6.8 g/dL (ref 6.0–8.3)

## 2016-11-27 LAB — CBC WITH DIFFERENTIAL/PLATELET
Basophils Absolute: 0.1 10*3/uL (ref 0.0–0.1)
Basophils Relative: 1.1 % (ref 0.0–3.0)
Eosinophils Absolute: 0.1 10*3/uL (ref 0.0–0.7)
Eosinophils Relative: 1.4 % (ref 0.0–5.0)
HCT: 40.4 % (ref 36.0–46.0)
Hemoglobin: 13.5 g/dL (ref 12.0–15.0)
Lymphocytes Relative: 33.4 % (ref 12.0–46.0)
Lymphs Abs: 3.1 10*3/uL (ref 0.7–4.0)
MCHC: 33.5 g/dL (ref 30.0–36.0)
MCV: 96.2 fl (ref 78.0–100.0)
Monocytes Absolute: 0.8 10*3/uL (ref 0.1–1.0)
Monocytes Relative: 8.1 % (ref 3.0–12.0)
Neutro Abs: 5.2 10*3/uL (ref 1.4–7.7)
Neutrophils Relative %: 56 % (ref 43.0–77.0)
Platelets: 200 10*3/uL (ref 150.0–400.0)
RBC: 4.2 Mil/uL (ref 3.87–5.11)
RDW: 12.4 % (ref 11.5–15.5)
WBC: 9.4 10*3/uL (ref 4.0–10.5)

## 2016-11-27 LAB — TSH: TSH: 3.79 u[IU]/mL (ref 0.35–4.50)

## 2016-11-27 LAB — VITAMIN D 25 HYDROXY (VIT D DEFICIENCY, FRACTURES): VITD: 24.91 ng/mL — ABNORMAL LOW (ref 30.00–100.00)

## 2016-11-28 ENCOUNTER — Encounter: Payer: Self-pay | Admitting: Family Medicine

## 2016-12-01 ENCOUNTER — Encounter: Payer: Self-pay | Admitting: Family Medicine

## 2016-12-03 ENCOUNTER — Telehealth: Payer: Self-pay | Admitting: Family Medicine

## 2016-12-03 NOTE — Telephone Encounter (Signed)
ROI faxed to [facility]

## 2016-12-16 DIAGNOSIS — M5431 Sciatica, right side: Secondary | ICD-10-CM | POA: Diagnosis not present

## 2016-12-16 DIAGNOSIS — M5106 Intervertebral disc disorders with myelopathy, lumbar region: Secondary | ICD-10-CM | POA: Diagnosis not present

## 2016-12-16 DIAGNOSIS — M461 Sacroiliitis, not elsewhere classified: Secondary | ICD-10-CM | POA: Diagnosis not present

## 2016-12-16 DIAGNOSIS — S344XXA Injury of lumbosacral plexus, initial encounter: Secondary | ICD-10-CM | POA: Diagnosis not present

## 2016-12-17 DIAGNOSIS — M5106 Intervertebral disc disorders with myelopathy, lumbar region: Secondary | ICD-10-CM | POA: Diagnosis not present

## 2016-12-17 DIAGNOSIS — M5431 Sciatica, right side: Secondary | ICD-10-CM | POA: Diagnosis not present

## 2016-12-17 DIAGNOSIS — S344XXA Injury of lumbosacral plexus, initial encounter: Secondary | ICD-10-CM | POA: Diagnosis not present

## 2016-12-17 DIAGNOSIS — M461 Sacroiliitis, not elsewhere classified: Secondary | ICD-10-CM | POA: Diagnosis not present

## 2016-12-18 DIAGNOSIS — M461 Sacroiliitis, not elsewhere classified: Secondary | ICD-10-CM | POA: Diagnosis not present

## 2016-12-18 DIAGNOSIS — M5106 Intervertebral disc disorders with myelopathy, lumbar region: Secondary | ICD-10-CM | POA: Diagnosis not present

## 2016-12-18 DIAGNOSIS — S344XXA Injury of lumbosacral plexus, initial encounter: Secondary | ICD-10-CM | POA: Diagnosis not present

## 2016-12-18 DIAGNOSIS — M5431 Sciatica, right side: Secondary | ICD-10-CM | POA: Diagnosis not present

## 2016-12-22 DIAGNOSIS — S344XXA Injury of lumbosacral plexus, initial encounter: Secondary | ICD-10-CM | POA: Diagnosis not present

## 2016-12-22 DIAGNOSIS — M461 Sacroiliitis, not elsewhere classified: Secondary | ICD-10-CM | POA: Diagnosis not present

## 2016-12-22 DIAGNOSIS — M5106 Intervertebral disc disorders with myelopathy, lumbar region: Secondary | ICD-10-CM | POA: Diagnosis not present

## 2016-12-22 DIAGNOSIS — M5431 Sciatica, right side: Secondary | ICD-10-CM | POA: Diagnosis not present

## 2016-12-23 DIAGNOSIS — Z1389 Encounter for screening for other disorder: Secondary | ICD-10-CM | POA: Diagnosis not present

## 2016-12-23 DIAGNOSIS — Z124 Encounter for screening for malignant neoplasm of cervix: Secondary | ICD-10-CM | POA: Diagnosis not present

## 2016-12-23 DIAGNOSIS — M461 Sacroiliitis, not elsewhere classified: Secondary | ICD-10-CM | POA: Diagnosis not present

## 2016-12-23 DIAGNOSIS — Z6828 Body mass index (BMI) 28.0-28.9, adult: Secondary | ICD-10-CM | POA: Diagnosis not present

## 2016-12-23 DIAGNOSIS — M5106 Intervertebral disc disorders with myelopathy, lumbar region: Secondary | ICD-10-CM | POA: Diagnosis not present

## 2016-12-23 DIAGNOSIS — M5431 Sciatica, right side: Secondary | ICD-10-CM | POA: Diagnosis not present

## 2016-12-23 DIAGNOSIS — Z01419 Encounter for gynecological examination (general) (routine) without abnormal findings: Secondary | ICD-10-CM | POA: Diagnosis not present

## 2016-12-23 DIAGNOSIS — S344XXA Injury of lumbosacral plexus, initial encounter: Secondary | ICD-10-CM | POA: Diagnosis not present

## 2016-12-23 DIAGNOSIS — Z130 Encounter for screening for diseases of the blood and blood-forming organs and certain disorders involving the immune mechanism: Secondary | ICD-10-CM | POA: Diagnosis not present

## 2016-12-23 DIAGNOSIS — Z1151 Encounter for screening for human papillomavirus (HPV): Secondary | ICD-10-CM | POA: Diagnosis not present

## 2016-12-24 DIAGNOSIS — M461 Sacroiliitis, not elsewhere classified: Secondary | ICD-10-CM | POA: Diagnosis not present

## 2016-12-24 DIAGNOSIS — M5431 Sciatica, right side: Secondary | ICD-10-CM | POA: Diagnosis not present

## 2016-12-24 DIAGNOSIS — M5106 Intervertebral disc disorders with myelopathy, lumbar region: Secondary | ICD-10-CM | POA: Diagnosis not present

## 2016-12-24 DIAGNOSIS — Z124 Encounter for screening for malignant neoplasm of cervix: Secondary | ICD-10-CM | POA: Diagnosis not present

## 2016-12-24 DIAGNOSIS — S344XXA Injury of lumbosacral plexus, initial encounter: Secondary | ICD-10-CM | POA: Diagnosis not present

## 2016-12-24 LAB — HM PAP SMEAR: HM Pap smear: NEGATIVE

## 2016-12-29 DIAGNOSIS — S344XXA Injury of lumbosacral plexus, initial encounter: Secondary | ICD-10-CM | POA: Diagnosis not present

## 2016-12-29 DIAGNOSIS — M5106 Intervertebral disc disorders with myelopathy, lumbar region: Secondary | ICD-10-CM | POA: Diagnosis not present

## 2016-12-29 DIAGNOSIS — M5431 Sciatica, right side: Secondary | ICD-10-CM | POA: Diagnosis not present

## 2016-12-29 DIAGNOSIS — M461 Sacroiliitis, not elsewhere classified: Secondary | ICD-10-CM | POA: Diagnosis not present

## 2016-12-30 DIAGNOSIS — M461 Sacroiliitis, not elsewhere classified: Secondary | ICD-10-CM | POA: Diagnosis not present

## 2016-12-30 DIAGNOSIS — M5431 Sciatica, right side: Secondary | ICD-10-CM | POA: Diagnosis not present

## 2016-12-30 DIAGNOSIS — M5106 Intervertebral disc disorders with myelopathy, lumbar region: Secondary | ICD-10-CM | POA: Diagnosis not present

## 2016-12-30 DIAGNOSIS — S344XXA Injury of lumbosacral plexus, initial encounter: Secondary | ICD-10-CM | POA: Diagnosis not present

## 2016-12-31 DIAGNOSIS — M5106 Intervertebral disc disorders with myelopathy, lumbar region: Secondary | ICD-10-CM | POA: Diagnosis not present

## 2016-12-31 DIAGNOSIS — M5431 Sciatica, right side: Secondary | ICD-10-CM | POA: Diagnosis not present

## 2016-12-31 DIAGNOSIS — M461 Sacroiliitis, not elsewhere classified: Secondary | ICD-10-CM | POA: Diagnosis not present

## 2016-12-31 DIAGNOSIS — S344XXA Injury of lumbosacral plexus, initial encounter: Secondary | ICD-10-CM | POA: Diagnosis not present

## 2017-01-02 ENCOUNTER — Ambulatory Visit: Payer: BLUE CROSS/BLUE SHIELD | Admitting: Family Medicine

## 2017-01-05 DIAGNOSIS — M461 Sacroiliitis, not elsewhere classified: Secondary | ICD-10-CM | POA: Diagnosis not present

## 2017-01-05 DIAGNOSIS — M5106 Intervertebral disc disorders with myelopathy, lumbar region: Secondary | ICD-10-CM | POA: Diagnosis not present

## 2017-01-05 DIAGNOSIS — M5431 Sciatica, right side: Secondary | ICD-10-CM | POA: Diagnosis not present

## 2017-01-05 DIAGNOSIS — S344XXA Injury of lumbosacral plexus, initial encounter: Secondary | ICD-10-CM | POA: Diagnosis not present

## 2017-01-08 DIAGNOSIS — M5431 Sciatica, right side: Secondary | ICD-10-CM | POA: Diagnosis not present

## 2017-01-08 DIAGNOSIS — M5106 Intervertebral disc disorders with myelopathy, lumbar region: Secondary | ICD-10-CM | POA: Diagnosis not present

## 2017-01-08 DIAGNOSIS — S344XXA Injury of lumbosacral plexus, initial encounter: Secondary | ICD-10-CM | POA: Diagnosis not present

## 2017-01-08 DIAGNOSIS — M461 Sacroiliitis, not elsewhere classified: Secondary | ICD-10-CM | POA: Diagnosis not present

## 2017-01-12 DIAGNOSIS — M5106 Intervertebral disc disorders with myelopathy, lumbar region: Secondary | ICD-10-CM | POA: Diagnosis not present

## 2017-01-12 DIAGNOSIS — M461 Sacroiliitis, not elsewhere classified: Secondary | ICD-10-CM | POA: Diagnosis not present

## 2017-01-12 DIAGNOSIS — S344XXA Injury of lumbosacral plexus, initial encounter: Secondary | ICD-10-CM | POA: Diagnosis not present

## 2017-01-12 DIAGNOSIS — M5431 Sciatica, right side: Secondary | ICD-10-CM | POA: Diagnosis not present

## 2017-01-13 DIAGNOSIS — M461 Sacroiliitis, not elsewhere classified: Secondary | ICD-10-CM | POA: Diagnosis not present

## 2017-01-13 DIAGNOSIS — S344XXA Injury of lumbosacral plexus, initial encounter: Secondary | ICD-10-CM | POA: Diagnosis not present

## 2017-01-13 DIAGNOSIS — M5106 Intervertebral disc disorders with myelopathy, lumbar region: Secondary | ICD-10-CM | POA: Diagnosis not present

## 2017-01-13 DIAGNOSIS — M5431 Sciatica, right side: Secondary | ICD-10-CM | POA: Diagnosis not present

## 2017-01-14 ENCOUNTER — Ambulatory Visit: Payer: BLUE CROSS/BLUE SHIELD | Admitting: Family Medicine

## 2017-01-16 DIAGNOSIS — M5431 Sciatica, right side: Secondary | ICD-10-CM | POA: Diagnosis not present

## 2017-01-16 DIAGNOSIS — M5106 Intervertebral disc disorders with myelopathy, lumbar region: Secondary | ICD-10-CM | POA: Diagnosis not present

## 2017-01-16 DIAGNOSIS — S344XXA Injury of lumbosacral plexus, initial encounter: Secondary | ICD-10-CM | POA: Diagnosis not present

## 2017-01-16 DIAGNOSIS — M461 Sacroiliitis, not elsewhere classified: Secondary | ICD-10-CM | POA: Diagnosis not present

## 2017-01-20 DIAGNOSIS — S344XXA Injury of lumbosacral plexus, initial encounter: Secondary | ICD-10-CM | POA: Diagnosis not present

## 2017-01-20 DIAGNOSIS — M5431 Sciatica, right side: Secondary | ICD-10-CM | POA: Diagnosis not present

## 2017-01-20 DIAGNOSIS — M5106 Intervertebral disc disorders with myelopathy, lumbar region: Secondary | ICD-10-CM | POA: Diagnosis not present

## 2017-01-20 DIAGNOSIS — M461 Sacroiliitis, not elsewhere classified: Secondary | ICD-10-CM | POA: Diagnosis not present

## 2017-01-26 DIAGNOSIS — M461 Sacroiliitis, not elsewhere classified: Secondary | ICD-10-CM | POA: Diagnosis not present

## 2017-01-26 DIAGNOSIS — M5431 Sciatica, right side: Secondary | ICD-10-CM | POA: Diagnosis not present

## 2017-01-26 DIAGNOSIS — S344XXA Injury of lumbosacral plexus, initial encounter: Secondary | ICD-10-CM | POA: Diagnosis not present

## 2017-01-26 DIAGNOSIS — M5106 Intervertebral disc disorders with myelopathy, lumbar region: Secondary | ICD-10-CM | POA: Diagnosis not present

## 2017-02-04 DIAGNOSIS — M5431 Sciatica, right side: Secondary | ICD-10-CM | POA: Diagnosis not present

## 2017-02-04 DIAGNOSIS — M461 Sacroiliitis, not elsewhere classified: Secondary | ICD-10-CM | POA: Diagnosis not present

## 2017-02-04 DIAGNOSIS — S344XXA Injury of lumbosacral plexus, initial encounter: Secondary | ICD-10-CM | POA: Diagnosis not present

## 2017-02-04 DIAGNOSIS — M5106 Intervertebral disc disorders with myelopathy, lumbar region: Secondary | ICD-10-CM | POA: Diagnosis not present

## 2017-02-11 DIAGNOSIS — M461 Sacroiliitis, not elsewhere classified: Secondary | ICD-10-CM | POA: Diagnosis not present

## 2017-02-11 DIAGNOSIS — S344XXA Injury of lumbosacral plexus, initial encounter: Secondary | ICD-10-CM | POA: Diagnosis not present

## 2017-02-11 DIAGNOSIS — M5106 Intervertebral disc disorders with myelopathy, lumbar region: Secondary | ICD-10-CM | POA: Diagnosis not present

## 2017-02-11 DIAGNOSIS — M5431 Sciatica, right side: Secondary | ICD-10-CM | POA: Diagnosis not present

## 2017-02-15 ENCOUNTER — Encounter: Payer: Self-pay | Admitting: Family Medicine

## 2017-02-18 DIAGNOSIS — S344XXA Injury of lumbosacral plexus, initial encounter: Secondary | ICD-10-CM | POA: Diagnosis not present

## 2017-02-18 DIAGNOSIS — M5106 Intervertebral disc disorders with myelopathy, lumbar region: Secondary | ICD-10-CM | POA: Diagnosis not present

## 2017-02-18 DIAGNOSIS — M5431 Sciatica, right side: Secondary | ICD-10-CM | POA: Diagnosis not present

## 2017-02-18 DIAGNOSIS — M461 Sacroiliitis, not elsewhere classified: Secondary | ICD-10-CM | POA: Diagnosis not present

## 2017-02-26 DIAGNOSIS — M5106 Intervertebral disc disorders with myelopathy, lumbar region: Secondary | ICD-10-CM | POA: Diagnosis not present

## 2017-02-26 DIAGNOSIS — S344XXA Injury of lumbosacral plexus, initial encounter: Secondary | ICD-10-CM | POA: Diagnosis not present

## 2017-02-26 DIAGNOSIS — M5431 Sciatica, right side: Secondary | ICD-10-CM | POA: Diagnosis not present

## 2017-02-26 DIAGNOSIS — M461 Sacroiliitis, not elsewhere classified: Secondary | ICD-10-CM | POA: Diagnosis not present

## 2017-02-27 DIAGNOSIS — L259 Unspecified contact dermatitis, unspecified cause: Secondary | ICD-10-CM | POA: Diagnosis not present

## 2017-03-09 NOTE — Telephone Encounter (Signed)
[facility]

## 2017-03-11 DIAGNOSIS — L259 Unspecified contact dermatitis, unspecified cause: Secondary | ICD-10-CM | POA: Diagnosis not present

## 2017-03-11 DIAGNOSIS — L719 Rosacea, unspecified: Secondary | ICD-10-CM | POA: Diagnosis not present

## 2017-03-19 ENCOUNTER — Telehealth: Payer: Self-pay

## 2017-03-19 NOTE — Telephone Encounter (Signed)
Copied from CRM [id number]. Topic: General - Other >> Mar 19, 2017  2:36 PM Darletta MollLander, Lumin L wrote: Reason for CRM: Wants to know if req for med req was received which was faxed today 12/06 this morning. Please call back to confirm.

## 2017-03-26 NOTE — Telephone Encounter (Signed)
Called and spoke with patient. Patient is unaware of what is needed and doesn't know what I am talking about. Nothing further needed at this.

## 2017-04-15 ENCOUNTER — Telehealth: Payer: Self-pay | Admitting: Family Medicine

## 2017-04-15 NOTE — Telephone Encounter (Signed)
Copied from CRM [id number]. Topic: Quick Communication - See Telephone Encounter >> Apr 15, 2017  3:34 PM Rudi CocoLathan, Latoya M, NT wrote: CRM for notification. See Telephone encounter for:   04/15/17. Pt. Calling to see if she can get a letter stating that she doesn't use tobacco products for [employer] life insurance policy. Fax number [phone] (pt. Job fax) Pt can be reached at [phone]

## 2017-04-15 NOTE — Telephone Encounter (Signed)
Copied from CRM [id number]. Topic: Quick Communication - See Telephone Encounter >> Apr 15, 2017  3:34 PM Rudi CocoLathan, Ange Puskas M, NT wrote: CRM for notification. See Telephone encounter for:  04/15/17.

## 2017-04-15 NOTE — Telephone Encounter (Signed)
04/15/17. Pt. Calling to see if she can get a letter stating that she doesn't use tobacco products for [employer] life insurance policy. Fax number [phone] (pt. Job fax) Pt can be reached at [phone]

## 2017-04-16 NOTE — Telephone Encounter (Signed)
Yes, that is fine to send her a letter.

## 2017-04-16 NOTE — Telephone Encounter (Signed)
Called and spoke with patient regarding chart having her as a current user of smokeless tobacco. Patient states that she has never used smokeless tobacco okay for letter to be written?

## 2017-04-21 ENCOUNTER — Encounter: Payer: Self-pay | Admitting: Emergency Medicine

## 2017-04-22 ENCOUNTER — Encounter: Payer: Self-pay | Admitting: Emergency Medicine

## 2017-04-27 NOTE — Telephone Encounter (Signed)
Letter sent to insurance company.

## 2017-05-22 ENCOUNTER — Ambulatory Visit: Payer: BLUE CROSS/BLUE SHIELD | Admitting: Family Medicine

## 2017-05-22 ENCOUNTER — Encounter: Payer: Self-pay | Admitting: Family Medicine

## 2017-05-22 VITALS — BP 108/62 | HR 71 | Temp 98.9°F | Wt 168.2 lb

## 2017-05-22 DIAGNOSIS — F419 Anxiety disorder, unspecified: Secondary | ICD-10-CM

## 2017-05-22 DIAGNOSIS — E559 Vitamin D deficiency, unspecified: Secondary | ICD-10-CM

## 2017-05-22 DIAGNOSIS — F329 Major depressive disorder, single episode, unspecified: Secondary | ICD-10-CM | POA: Diagnosis not present

## 2017-05-22 DIAGNOSIS — F32A Depression, unspecified: Secondary | ICD-10-CM

## 2017-05-22 LAB — VITAMIN D 25 HYDROXY (VIT D DEFICIENCY, FRACTURES): VITD: 19.81 ng/mL — ABNORMAL LOW (ref 30.00–100.00)

## 2017-05-22 MED ORDER — ESCITALOPRAM OXALATE 10 MG PO TABS: 10.0000 mg | ORAL_TABLET | Freq: Every day | ORAL | 1 refills | Status: DC

## 2017-05-22 NOTE — Progress Notes (Signed)
   Subjective:    Patient ID: Ruth LighterAngela M Roussin, female    DOB: Oct 17, 1980, 37 y.o.   MRN: [id number]  HPI This is a 37 yo female who presents today for follow up of anxiety and depression.  Has been doing well with medication. Was on a business trip and forgot her medication, didn't have for 2 days, had a panic attack driving home. Only one she has had since starting medication.  Stopped vitamin D supplementation because it seemed to alter her taste buds. Was also taking biotin at the time- stopped them both.   Recently had more work given to her- additional property in [place].   Past Medical History:  Diagnosis Date  . Degenerative disc disease   . Frequent headaches   . History of chicken pox   . Inguinal hernia   . UTI (lower urinary tract infection)    Past Surgical History:  Procedure Laterality Date  . CERVICAL FUSION    . Neck fusion, C5 & C6  2010  . TONSILLECTOMY Bilateral 2002   Family History  Problem Relation Age of Onset  . Arthritis Mother   . Asthma Mother   . Cancer Mother   . Breast cancer Mother [id number]       Right Lumpectomy 11/26/2016  . Depression Mother   . Arthritis Maternal Grandmother   . Asthma Maternal Grandmother   . Lung cancer Maternal Grandmother   . Leukemia Paternal Grandmother   . Cancer Paternal Grandfather    Social History   Tobacco Use  . Smoking status: Former Smoker    Packs/day: 1.00    Years: 10.00    Pack years: 10.00    Types: Cigarettes    Last attempt to quit: 04/14/2006    Years since quitting: 11.1  . Smokeless tobacco: Current User    Types: Snuff  Substance Use Topics  . Alcohol use: Yes    Comment: socially wine  . Drug use: No      Review of Systems Per HPI    Objective:   Physical Exam Physical Exam  Vitals reviewed. Constitutional: Oriented to person, place, and time. Appears well-developed and well-nourished.  HENT:  Head: Normocephalic and atraumatic.  Eyes: Conjunctivae are normal.  Neck:  Normal range of motion. Neck supple.  Cardiovascular: Normal rate.   Pulmonary/Chest: Effort normal.  Musculoskeletal: Normal range of motion.  Neurological: Alert and oriented to person, place, and time.  Skin: Skin is warm and dry.  Psychiatric: Normal mood and affect. Behavior is normal. Judgment and thought content normal.     BP 108/62   Pulse 71   Temp 98.9 F (37.2 C) (Oral)   Wt 168 lb 4 oz (76.3 kg)   SpO2 97%   BMI 28.21 kg/m  Wt Readings from Last 3 Encounters:  05/22/17 168 lb 4 oz (76.3 kg)  11/26/16 157 lb 8 oz (71.4 kg)  10/10/12 162 lb (73.5 kg)        Assessment & Plan:  1. Anxiety and depression - doing well on escitalopram, will continue current dose - escitalopram (LEXAPRO) 10 MG tablet; Take 1 tablet (10 mg total) by mouth daily.  Dispense: 90 tablet; Refill: 1  2. Vitamin D insufficiency - Vitamin D, 25-hydroxy  - follow up in 6 months  Olean Reeeborah Sindi Beckworth, FNP-BC  [facility], [facility]  05/22/2017 9:26 AM

## 2017-05-22 NOTE — Patient Instructions (Signed)
Good to see you today  I will notify you of vitamin D level  Please follow up in 6 months

## 2017-06-16 DIAGNOSIS — R6889 Other general symptoms and signs: Secondary | ICD-10-CM | POA: Diagnosis not present

## 2017-06-16 DIAGNOSIS — B349 Viral infection, unspecified: Secondary | ICD-10-CM | POA: Diagnosis not present

## 2017-06-16 DIAGNOSIS — Z20828 Contact with and (suspected) exposure to other viral communicable diseases: Secondary | ICD-10-CM | POA: Diagnosis not present

## 2017-06-16 DIAGNOSIS — J101 Influenza due to other identified influenza virus with other respiratory manifestations: Secondary | ICD-10-CM | POA: Diagnosis not present

## 2017-10-12 DIAGNOSIS — M5441 Lumbago with sciatica, right side: Secondary | ICD-10-CM | POA: Diagnosis not present

## 2017-10-27 DIAGNOSIS — M533 Sacrococcygeal disorders, not elsewhere classified: Secondary | ICD-10-CM | POA: Diagnosis not present

## 2017-12-04 ENCOUNTER — Other Ambulatory Visit: Payer: Self-pay | Admitting: Family Medicine

## 2017-12-04 DIAGNOSIS — F32A Depression, unspecified: Secondary | ICD-10-CM

## 2017-12-04 DIAGNOSIS — F419 Anxiety disorder, unspecified: Principal | ICD-10-CM

## 2017-12-04 DIAGNOSIS — F329 Major depressive disorder, single episode, unspecified: Secondary | ICD-10-CM

## 2017-12-09 ENCOUNTER — Ambulatory Visit: Payer: BLUE CROSS/BLUE SHIELD | Admitting: Family Medicine

## 2017-12-09 ENCOUNTER — Encounter: Payer: Self-pay | Admitting: Family Medicine

## 2017-12-09 VITALS — BP 116/64 | HR 84 | Temp 98.6°F | Ht 64.75 in | Wt 173.0 lb

## 2017-12-09 DIAGNOSIS — F32A Depression, unspecified: Secondary | ICD-10-CM | POA: Insufficient documentation

## 2017-12-09 DIAGNOSIS — N200 Calculus of kidney: Secondary | ICD-10-CM | POA: Insufficient documentation

## 2017-12-09 DIAGNOSIS — F329 Major depressive disorder, single episode, unspecified: Secondary | ICD-10-CM

## 2017-12-09 DIAGNOSIS — F419 Anxiety disorder, unspecified: Secondary | ICD-10-CM | POA: Diagnosis not present

## 2017-12-09 DIAGNOSIS — Q742 Other congenital malformations of lower limb(s), including pelvic girdle: Secondary | ICD-10-CM | POA: Insufficient documentation

## 2017-12-09 DIAGNOSIS — E559 Vitamin D deficiency, unspecified: Secondary | ICD-10-CM

## 2017-12-09 DIAGNOSIS — F5104 Psychophysiologic insomnia: Secondary | ICD-10-CM

## 2017-12-09 DIAGNOSIS — O039 Complete or unspecified spontaneous abortion without complication: Secondary | ICD-10-CM | POA: Insufficient documentation

## 2017-12-09 DIAGNOSIS — D649 Anemia, unspecified: Secondary | ICD-10-CM | POA: Insufficient documentation

## 2017-12-09 MED ORDER — ESCITALOPRAM OXALATE 10 MG PO TABS: 10.0000 mg | ORAL_TABLET | Freq: Every day | ORAL | 1 refills | Status: DC

## 2017-12-09 NOTE — Patient Instructions (Signed)
Try some Remfresh, I suggest trying 1 for a couple of nights and can go to 2   Try some saline spray at bedtime for nasal congestion  Calm- app for relaxation  Insomnia Insomnia is a sleep disorder that makes it difficult to fall asleep or to stay asleep. Insomnia can cause tiredness (fatigue), low energy, difficulty concentrating, mood swings, and poor performance at work or school. There are three different ways to classify insomnia:  Difficulty falling asleep.  Difficulty staying asleep.  Waking up too early in the morning.  Any type of insomnia can be long-term (chronic) or short-term (acute). Both are common. Short-term insomnia usually lasts for three months or less. Chronic insomnia occurs at least three times a week for longer than three months. What are the causes? Insomnia may be caused by another condition, situation, or substance, such as:  Anxiety.  Certain medicines.  Gastroesophageal reflux disease (GERD) or other gastrointestinal conditions.  Asthma or other breathing conditions.  Restless legs syndrome, sleep apnea, or other sleep disorders.  Chronic pain.  Menopause. This may include hot flashes.  Stroke.  Abuse of alcohol, tobacco, or illegal drugs.  Depression.  Caffeine.  Neurological disorders, such as Alzheimer disease.  An overactive thyroid (hyperthyroidism).  The cause of insomnia may not be known. What increases the risk? Risk factors for insomnia include:  Gender. Women are more commonly affected than men.  Age. Insomnia is more common as you get older.  Stress. This may involve your professional or personal life.  Income. Insomnia is more common in people with lower income.  Lack of exercise.  Irregular work schedule or night shifts.  Traveling between different time zones.  What are the signs or symptoms? If you have insomnia, trouble falling asleep or trouble staying asleep is the main symptom. This may lead to other  symptoms, such as:  Feeling fatigued.  Feeling nervous about going to sleep.  Not feeling rested in the morning.  Having trouble concentrating.  Feeling irritable, anxious, or depressed.  How is this treated? Treatment for insomnia depends on the cause. If your insomnia is caused by an underlying condition, treatment will focus on addressing the condition. Treatment may also include:  Medicines to help you sleep.  Counseling or therapy.  Lifestyle adjustments.  Follow these instructions at home:  Take medicines only as directed by your health care provider.  Keep regular sleeping and waking hours. Avoid naps.  Keep a sleep diary to help you and your health care provider figure out what could be causing your insomnia. Include: ? When you sleep. ? When you wake up during the night. ? How well you sleep. ? How rested you feel the next day. ? Any side effects of medicines you are taking. ? What you eat and drink.  Make your bedroom a comfortable place where it is easy to fall asleep: ? Put up shades or special blackout curtains to block light from outside. ? Use a white noise machine to block noise. ? Keep the temperature cool.  Exercise regularly as directed by your health care provider. Avoid exercising right before bedtime.  Use relaxation techniques to manage stress. Ask your health care provider to suggest some techniques that may work well for you. These may include: ? Breathing exercises. ? Routines to release muscle tension. ? Visualizing peaceful scenes.  Cut back on alcohol, caffeinated beverages, and cigarettes, especially close to bedtime. These can disrupt your sleep.  Do not overeat or eat spicy foods right  before bedtime. This can lead to digestive discomfort that can make it hard for you to sleep.  Limit screen use before bedtime. This includes: ? Watching TV. ? Using your smartphone, tablet, and computer.  Stick to a routine. This can help you fall  asleep faster. Try to do a quiet activity, brush your teeth, and go to bed at the same time each night.  Get out of bed if you are still awake after 15 minutes of trying to sleep. Keep the lights down, but try reading or doing a quiet activity. When you feel sleepy, go back to bed.  Make sure that you drive carefully. Avoid driving if you feel very sleepy.  Keep all follow-up appointments as directed by your health care provider. This is important. Contact a health care provider if:  You are tired throughout the day or have trouble in your daily routine due to sleepiness.  You continue to have sleep problems or your sleep problems get worse. Get help right away if:  You have serious thoughts about hurting yourself or someone else. This information is not intended to replace advice given to you by your health care provider. Make sure you discuss any questions you have with your health care provider. Document Released: 03/28/2000 Document Revised: 08/31/2015 Document Reviewed: 12/30/2013 Elsevier Interactive Patient Education  [employer].

## 2017-12-09 NOTE — Progress Notes (Signed)
Subjective:    Patient ID: Ruth Blevins, female    DOB: 1981/01/09, 37 y.o.   MRN: [id number]  HPI This is a 37 yo female who presents today for follow up of depression.  Has been very stretched at work, youngest child started kindergarten yesterday. Not sleeping well, having trouble staying asleep. Increased stress at work keeps her mind racing when she awakes from sleep. Feels stretched between work and home life, little exercise. Good support from husband. Feels like escitalopram 10 mg helps keep her balanced.  Has appointment with gyn November- new Mirena Not taking vitamin D.   Past Medical History:  Diagnosis Date  . Degenerative disc disease   . Frequent headaches   . History of chicken pox   . Inguinal hernia   . UTI (lower urinary tract infection)    Past Surgical History:  Procedure Laterality Date  . CERVICAL FUSION    . Neck fusion, C5 & C6  2010  . TONSILLECTOMY Bilateral 2002   Family History  Problem Relation Age of Onset  . Arthritis Mother   . Asthma Mother   . Cancer Mother   . Breast cancer Mother [id number]       Right Lumpectomy 11/26/2016  . Depression Mother   . Arthritis Maternal Grandmother   . Asthma Maternal Grandmother   . Lung cancer Maternal Grandmother   . Leukemia Paternal Grandmother   . Cancer Paternal Grandfather    Social History   Tobacco Use  . Smoking status: Former Smoker    Packs/day: 1.00    Years: 10.00    Pack years: 10.00    Types: Cigarettes    Last attempt to quit: 04/14/2006    Years since quitting: 11.6  . Smokeless tobacco: Current User    Types: Snuff  Substance Use Topics  . Alcohol use: Yes    Comment: socially wine  . Drug use: No      Review of Systems Per HPI    Objective:   Physical Exam    BP 116/64   Pulse 84   Temp 98.6 F (37 C) (Oral)   Ht 5' 4.75" (1.645 m)   Wt 173 lb (78.5 kg)   SpO2 99%   BMI 29.01 kg/m  Wt Readings from Last 3 Encounters:  12/09/17 173 lb (78.5 kg)  05/22/17  168 lb 4 oz (76.3 kg)  11/26/16 157 lb 8 oz (71.4 kg)   Depression screen [facility] 12/09/2017 11/26/2016  Decreased Interest 1 1  Down, Depressed, Hopeless 1 2  PHQ - 2 Score 2 3  Altered sleeping 3 3  Tired, decreased energy 2 2  Change in appetite 0 3  Feeling bad or failure about yourself  1 1  Trouble concentrating 0 1  Moving slowly or fidgety/restless 0 0  Suicidal thoughts 0 0  PHQ-9 Score 8 13  Difficult doing work/chores - Somewhat difficult       Assessment & Plan:  1. Anxiety and depression - some increased situational stress, doing better, continue current dose escitalopram, encouraged regular exercise - escitalopram (LEXAPRO) 10 MG tablet; Take 1 tablet (10 mg total) by mouth daily.  Dispense: 90 tablet; Refill: 1 - follow up in 6 months  2. Psychophysiological insomnia - encouraged meditation, can try sustained release melatonin - Provided written and verbal information regarding diagnosis and treatment.   3. Vitamin D insufficiency - has not been taking supplementation, discussed importance of bringing levels to normal range, patient agrees to take   -  follow up in 6 months, will check vitamin D at that time   Olean Ree, FNP-BC   [facility] at [facility], [facility]  12/09/2017 2:35 PM

## 2018-01-20 ENCOUNTER — Encounter: Payer: Self-pay | Admitting: Family Medicine

## 2018-02-15 ENCOUNTER — Encounter: Payer: Self-pay | Admitting: Family Medicine

## 2018-02-15 DIAGNOSIS — F32A Depression, unspecified: Secondary | ICD-10-CM

## 2018-02-15 DIAGNOSIS — Z01419 Encounter for gynecological examination (general) (routine) without abnormal findings: Secondary | ICD-10-CM | POA: Diagnosis not present

## 2018-02-15 DIAGNOSIS — Z1389 Encounter for screening for other disorder: Secondary | ICD-10-CM | POA: Diagnosis not present

## 2018-02-15 DIAGNOSIS — F419 Anxiety disorder, unspecified: Principal | ICD-10-CM

## 2018-02-15 DIAGNOSIS — Z6832 Body mass index (BMI) 32.0-32.9, adult: Secondary | ICD-10-CM | POA: Diagnosis not present

## 2018-02-15 DIAGNOSIS — Z130 Encounter for screening for diseases of the blood and blood-forming organs and certain disorders involving the immune mechanism: Secondary | ICD-10-CM | POA: Diagnosis not present

## 2018-02-15 DIAGNOSIS — Z30433 Encounter for removal and reinsertion of intrauterine contraceptive device: Secondary | ICD-10-CM | POA: Diagnosis not present

## 2018-02-15 DIAGNOSIS — F329 Major depressive disorder, single episode, unspecified: Secondary | ICD-10-CM

## 2018-02-18 ENCOUNTER — Other Ambulatory Visit: Payer: Self-pay | Admitting: Family Medicine

## 2018-02-18 DIAGNOSIS — F32A Depression, unspecified: Secondary | ICD-10-CM

## 2018-02-18 DIAGNOSIS — F329 Major depressive disorder, single episode, unspecified: Secondary | ICD-10-CM

## 2018-02-18 DIAGNOSIS — F419 Anxiety disorder, unspecified: Principal | ICD-10-CM

## 2018-02-18 MED ORDER — ESCITALOPRAM OXALATE 10 MG PO TABS: 10.0000 mg | ORAL_TABLET | Freq: Two times a day (BID) | ORAL | 0 refills | Status: DC

## 2018-02-18 MED ORDER — ESCITALOPRAM OXALATE 20 MG PO TABS: 20.0000 mg | ORAL_TABLET | Freq: Every day | ORAL | 1 refills | Status: DC

## 2018-02-18 NOTE — Telephone Encounter (Signed)
After reviewing my chart notes it does mention going to 20mg . Refilled for 180. Nothing further needed at this time.

## 2018-02-18 NOTE — Telephone Encounter (Signed)
Pt left v/m; pt is out of lexapro. On pts med list has lexapro 10 mg taking on tab daily; per my chart message pt has been taking lexapro 10 mg taking two tabs. Last refill lexapro 10 mg # 90 x 1 on 12/09/17. Please advise. Pt request cb.

## 2018-02-18 NOTE — Telephone Encounter (Signed)
Per last office note it states that patient should continue one 10mg  a day. Don't see where the order was changed?

## 2018-02-19 ENCOUNTER — Encounter: Payer: Self-pay | Admitting: Family Medicine

## 2018-03-12 ENCOUNTER — Other Ambulatory Visit: Payer: Self-pay | Admitting: Family Medicine

## 2018-03-12 DIAGNOSIS — F329 Major depressive disorder, single episode, unspecified: Secondary | ICD-10-CM

## 2018-03-12 DIAGNOSIS — F419 Anxiety disorder, unspecified: Principal | ICD-10-CM

## 2018-03-12 DIAGNOSIS — F32A Depression, unspecified: Secondary | ICD-10-CM

## 2018-04-19 DIAGNOSIS — J029 Acute pharyngitis, unspecified: Secondary | ICD-10-CM | POA: Diagnosis not present

## 2018-07-26 ENCOUNTER — Encounter: Payer: Self-pay | Admitting: Family Medicine

## 2018-07-28 ENCOUNTER — Encounter: Payer: Self-pay | Admitting: Family Medicine

## 2018-07-28 ENCOUNTER — Other Ambulatory Visit: Payer: Self-pay

## 2018-07-28 ENCOUNTER — Ambulatory Visit (INDEPENDENT_AMBULATORY_CARE_PROVIDER_SITE_OTHER): Payer: Self-pay | Admitting: Family Medicine

## 2018-07-28 VITALS — Ht 64.75 in | Wt 180.0 lb

## 2018-07-28 DIAGNOSIS — R61 Generalized hyperhidrosis: Secondary | ICD-10-CM

## 2018-07-28 DIAGNOSIS — E559 Vitamin D deficiency, unspecified: Secondary | ICD-10-CM

## 2018-07-28 DIAGNOSIS — R002 Palpitations: Secondary | ICD-10-CM

## 2018-07-28 LAB — CBC WITH DIFFERENTIAL/PLATELET
Basophils Absolute: 0 10*3/uL (ref 0.0–0.1)
Basophils Relative: 0.4 % (ref 0.0–3.0)
Eosinophils Absolute: 0.1 10*3/uL (ref 0.0–0.7)
Eosinophils Relative: 2.2 % (ref 0.0–5.0)
HCT: 37 % (ref 36.0–46.0)
Hemoglobin: 12.8 g/dL (ref 12.0–15.0)
Lymphocytes Relative: 30 % (ref 12.0–46.0)
Lymphs Abs: 1.9 10*3/uL (ref 0.7–4.0)
MCHC: 34.7 g/dL (ref 30.0–36.0)
MCV: 93 fl (ref 78.0–100.0)
Monocytes Absolute: 0.5 10*3/uL (ref 0.1–1.0)
Monocytes Relative: 8.7 % (ref 3.0–12.0)
Neutro Abs: 3.7 10*3/uL (ref 1.4–7.7)
Neutrophils Relative %: 58.7 % (ref 43.0–77.0)
Platelets: 177 10*3/uL (ref 150.0–400.0)
RBC: 3.98 Mil/uL (ref 3.87–5.11)
RDW: 12.4 % (ref 11.5–15.5)
WBC: 6.3 10*3/uL (ref 4.0–10.5)

## 2018-07-28 LAB — COMPREHENSIVE METABOLIC PANEL
ALT: 17 U/L (ref 0–35)
AST: 21 U/L (ref 0–37)
Albumin: 4.3 g/dL (ref 3.5–5.2)
Alkaline Phosphatase: 58 U/L (ref 39–117)
BUN: 10 mg/dL (ref 6–23)
CO2: 27 mEq/L (ref 19–32)
Calcium: 9.1 mg/dL (ref 8.4–10.5)
Chloride: 106 mEq/L (ref 96–112)
Creatinine, Ser: 0.6 mg/dL (ref 0.40–1.20)
GFR: 112.88 mL/min (ref >60.00)
Glucose, Bld: 89 mg/dL (ref 70–99)
Potassium: 4.2 mEq/L (ref 3.5–5.1)
Sodium: 140 mEq/L (ref 135–145)
Total Bilirubin: 0.4 mg/dL (ref 0.2–1.2)
Total Protein: 7.1 g/dL (ref 6.0–8.3)

## 2018-07-28 LAB — HEMOGLOBIN A1C: Hgb A1c MFr Bld: 5.5 % (ref 4.6–6.5)

## 2018-07-28 LAB — TSH: TSH: 4.8 u[IU]/mL — ABNORMAL HIGH (ref 0.35–4.50)

## 2018-07-28 LAB — VITAMIN D 25 HYDROXY (VIT D DEFICIENCY, FRACTURES): VITD: 23.3 ng/mL — ABNORMAL LOW (ref 30.00–100.00)

## 2018-07-28 NOTE — Progress Notes (Signed)
Virtual Visit via Video Note  I connected with Ruth Blevins on 07/28/18 at  8:04 AM EDT by a video enabled telemedicine application and verified that I am speaking with the correct person using two identifiers.  The patient was in her home and I was in my office.   I discussed the limitations of evaluation and management by telemedicine and the availability of in person appointments. The patient expressed understanding and agreed to proceed.  History of Present Illness: This is a 38 year old female who request a virtual visit.  It was determined that it was not advisable to bring her in the office due to COVID-19 pandemic.  The patient reports 4 to 6 weeks of night sweats.  At first these were intermittent but has more recently become nightly.  She denies any hot flashes during the day.  She also reports that she has had some weird dreams lately.  She is not taking any new medications or supplements.  She did have her Mirena IUD switched out 02/2018.  She has a monthly menstrual that last for approximately 1 day and does light.  It is regular as of late.  She sleeps about 8 hours a night.  She always feels fatigued but suspects this is due to recent changes with her children being homeschooled and she and her husband continuing to work.  She reports that her weight is stable.  She checks her temperature daily because she does visits to our offices.  She has not run a fever.  She has not had any cough, bowel or bladder changes. She reports that she feels her heartbeat when she lies down and that sometimes it feels erratic and fast.  She denies chest pain.  She drinks 2 cups of coffee in the morning, no caffeine otherwise.  She thinks her water intake is adequate.   Observations/Objective: The patient is alert and answers questions appropriately.  Head is normal normocephalic.  She is normally conversive does not appear short of breath.  Visible skin unremarkable.  Her mood and affect are  appropriate.  Assessment and Plan: 1. Night sweats - CBC with Differential - Comprehensive metabolic panel - TSH - Hemoglobin A1c -Follow-up to be determined by lab results  2. Palpitations - CBC with Differential - Comprehensive metabolic panel - TSH - Hemoglobin A1c  3. Vitamin D insufficiency - Vitamin D, 25-hydroxy   Olean Ree, FNP-BC  [facility] at [facility], [facility]  07/28/2018 1:41 PM   Follow Up Instructions: MyChart message with summary of virtual visit as well as recommendations was sent to patient   I discussed the assessment and treatment plan with the patient. The patient was provided an opportunity to ask questions and all were answered. The patient agreed with the plan and demonstrated an understanding of the instructions.   The patient was advised to call back or seek an in-person evaluation if the symptoms worsen or if the condition fails to improve as anticipated.  I provided 13 minutes of non-face-to-face time during this encounter.   Emi Belfast, FNP

## 2018-08-19 ENCOUNTER — Other Ambulatory Visit: Payer: Self-pay | Admitting: Family Medicine

## 2018-08-19 DIAGNOSIS — F419 Anxiety disorder, unspecified: Principal | ICD-10-CM

## 2018-08-19 DIAGNOSIS — F32A Depression, unspecified: Secondary | ICD-10-CM

## 2018-08-19 DIAGNOSIS — F329 Major depressive disorder, single episode, unspecified: Secondary | ICD-10-CM

## 2018-10-11 DIAGNOSIS — Z20828 Contact with and (suspected) exposure to other viral communicable diseases: Secondary | ICD-10-CM | POA: Diagnosis not present

## 2018-10-13 DIAGNOSIS — Z20828 Contact with and (suspected) exposure to other viral communicable diseases: Secondary | ICD-10-CM | POA: Diagnosis not present

## 2018-10-13 DIAGNOSIS — H60501 Unspecified acute noninfective otitis externa, right ear: Secondary | ICD-10-CM | POA: Diagnosis not present

## 2019-02-24 DIAGNOSIS — Z03818 Encounter for observation for suspected exposure to other biological agents ruled out: Secondary | ICD-10-CM | POA: Diagnosis not present

## 2019-02-24 DIAGNOSIS — J029 Acute pharyngitis, unspecified: Secondary | ICD-10-CM | POA: Diagnosis not present

## 2019-02-24 DIAGNOSIS — R05 Cough: Secondary | ICD-10-CM | POA: Diagnosis not present

## 2019-02-24 DIAGNOSIS — H9203 Otalgia, bilateral: Secondary | ICD-10-CM | POA: Diagnosis not present

## 2019-02-24 DIAGNOSIS — J019 Acute sinusitis, unspecified: Secondary | ICD-10-CM | POA: Diagnosis not present

## 2019-03-01 DIAGNOSIS — Z888 Allergy status to other drugs, medicaments and biological substances status: Secondary | ICD-10-CM | POA: Diagnosis not present

## 2019-03-01 DIAGNOSIS — R9431 Abnormal electrocardiogram [ECG] [EKG]: Secondary | ICD-10-CM | POA: Diagnosis not present

## 2019-03-01 DIAGNOSIS — R0789 Other chest pain: Secondary | ICD-10-CM | POA: Diagnosis not present

## 2019-03-01 DIAGNOSIS — J209 Acute bronchitis, unspecified: Secondary | ICD-10-CM | POA: Diagnosis not present

## 2019-03-01 DIAGNOSIS — R0602 Shortness of breath: Secondary | ICD-10-CM | POA: Diagnosis not present

## 2019-03-01 DIAGNOSIS — J40 Bronchitis, not specified as acute or chronic: Secondary | ICD-10-CM | POA: Diagnosis not present

## 2019-03-01 DIAGNOSIS — J329 Chronic sinusitis, unspecified: Secondary | ICD-10-CM | POA: Diagnosis not present

## 2019-03-01 DIAGNOSIS — Z975 Presence of (intrauterine) contraceptive device: Secondary | ICD-10-CM | POA: Diagnosis not present

## 2019-03-01 DIAGNOSIS — R05 Cough: Secondary | ICD-10-CM | POA: Diagnosis not present

## 2019-03-01 DIAGNOSIS — J019 Acute sinusitis, unspecified: Secondary | ICD-10-CM | POA: Diagnosis not present

## 2019-03-01 DIAGNOSIS — Z20828 Contact with and (suspected) exposure to other viral communicable diseases: Secondary | ICD-10-CM | POA: Diagnosis not present

## 2019-03-01 DIAGNOSIS — R918 Other nonspecific abnormal finding of lung field: Secondary | ICD-10-CM | POA: Diagnosis not present

## 2019-03-08 ENCOUNTER — Other Ambulatory Visit: Payer: Self-pay

## 2019-03-08 DIAGNOSIS — F329 Major depressive disorder, single episode, unspecified: Secondary | ICD-10-CM

## 2019-03-08 DIAGNOSIS — F32A Depression, unspecified: Secondary | ICD-10-CM

## 2019-03-08 NOTE — Telephone Encounter (Signed)
I received a refill request for Escitalopram 20mg  tablets. This was last refilled on 09/08/18 for #90 with 0 refills. Patient was last seen in the office on 07/28/18. Is this medication ok to refill?

## 2019-03-09 MED ORDER — ESCITALOPRAM OXALATE 20 MG PO TABS: ORAL_TABLET | ORAL | 1 refills | Status: DC

## 2019-03-14 ENCOUNTER — Ambulatory Visit (INDEPENDENT_AMBULATORY_CARE_PROVIDER_SITE_OTHER): Payer: BC Managed Care – PPO | Admitting: Family Medicine

## 2019-03-14 ENCOUNTER — Encounter: Payer: Self-pay | Admitting: Family Medicine

## 2019-03-14 ENCOUNTER — Other Ambulatory Visit: Payer: Self-pay

## 2019-03-14 VITALS — BP 100/60 | HR 66 | Temp 98.1°F | Wt 179.0 lb

## 2019-03-14 DIAGNOSIS — F329 Major depressive disorder, single episode, unspecified: Secondary | ICD-10-CM

## 2019-03-14 DIAGNOSIS — E559 Vitamin D deficiency, unspecified: Secondary | ICD-10-CM

## 2019-03-14 DIAGNOSIS — R946 Abnormal results of thyroid function studies: Secondary | ICD-10-CM | POA: Diagnosis not present

## 2019-03-14 DIAGNOSIS — R911 Solitary pulmonary nodule: Secondary | ICD-10-CM | POA: Diagnosis not present

## 2019-03-14 DIAGNOSIS — F419 Anxiety disorder, unspecified: Secondary | ICD-10-CM

## 2019-03-14 DIAGNOSIS — E669 Obesity, unspecified: Secondary | ICD-10-CM

## 2019-03-14 DIAGNOSIS — F32A Depression, unspecified: Secondary | ICD-10-CM

## 2019-03-14 DIAGNOSIS — E66811 Obesity, class 1: Secondary | ICD-10-CM

## 2019-03-14 LAB — T4, FREE: Free T4: 0.68 ng/dL (ref 0.60–1.60)

## 2019-03-14 LAB — TSH: TSH: 2.58 u[IU]/mL (ref 0.35–4.50)

## 2019-03-14 LAB — VITAMIN D 25 HYDROXY (VIT D DEFICIENCY, FRACTURES): VITD: 18.43 ng/mL — ABNORMAL LOW (ref 30.00–100.00)

## 2019-03-14 NOTE — Progress Notes (Signed)
Subjective:    Patient ID: Ruth Blevins, female    DOB: 12-15-1980, 38 y.o.   MRN: [id number]  HPI This is a 38 yo female who presents today for follow up of chronic medical conditions. Has been doing well. Recently with bronchitis and double ear infection. Symptoms resolved. She does want to discuss incidental pulmonary nodules found on CXR, CT.  High stress with work, kids, pandemic. Exercising, feels like she is coping fairly well. Sleep occasionally disturbed.   Past Medical History:  Diagnosis Date  . Degenerative disc disease   . Frequent headaches   . History of chicken pox   . Inguinal hernia   . UTI (lower urinary tract infection)    Past Surgical History:  Procedure Laterality Date  . CERVICAL FUSION    . Neck fusion, C5 & C6  2010  . TONSILLECTOMY Bilateral 2002   Family History  Problem Relation Age of Onset  . Arthritis Mother   . Asthma Mother   . Cancer Mother   . Breast cancer Mother 32       Right Lumpectomy 11/26/2016  . Depression Mother   . Arthritis Maternal Grandmother   . Asthma Maternal Grandmother   . Lung cancer Maternal Grandmother   . Leukemia Paternal Grandmother   . Cancer Paternal Grandfather    Social History   Tobacco Use  . Smoking status: Former Smoker    Packs/day: 1.00    Years: 10.00    Pack years: 10.00    Types: Cigarettes    Quit date: 04/14/2006    Years since quitting: 12.9  . Smokeless tobacco: Current User    Types: Snuff  Substance Use Topics  . Alcohol use: Yes    Comment: socially wine  . Drug use: No        Review of Systems Per HPI    Objective:   Physical Exam Physical Exam  Vitals reviewed. Constitutional: Oriented to person, place, and time. Appears well-developed and well-nourished.  HENT:  Head: Normocephalic and atraumatic.  Eyes: Conjunctivae are normal.  Neck: Normal range of motion. Neck supple.  Cardiovascular: Normal rate.   Pulmonary/Chest: Effort normal.  Musculoskeletal: Normal  range of motion.  Neurological: Alert and oriented to person, place, and time.  Psychiatric: Normal mood and affect. Behavior is normal. Judgment and thought content normal.      Temp 98.1 F (36.7 C) (Temporal)   Wt 179 lb (81.2 kg)   BMI 30.02 kg/m  Wt Readings from Last 3 Encounters:  03/14/19 179 lb (81.2 kg)  07/28/18 180 lb (81.6 kg)  12/09/17 173 lb (78.5 kg)   BP 100/60 (BP Location: Left Arm, Patient Position: Sitting)   Pulse 66   Temp 98.1 F (36.7 C) (Temporal)   Wt 179 lb (81.2 kg)   BMI 30.02 kg/m      Assessment & Plan:  1. Vitamin D deficiency - Vitamin D, 25-hydroxy  2. Abnormal thyroid function test - TSH - T4, Free - T3; Future  3. Incidental pulmonary nodule, > 48mm and < 22mm - Reviewed CT and discussed findings and recommendations with patient. No follow up recommended.   4. Anxiety and depression - continue current escitalopram 20 mg  5. Obesity, class 1 - encouraged continued exercise, provided written and verbal information regarding diet recommendations  This visit occurred during the SARS-CoV-2 public health emergency.  Safety protocols were in place, including screening questions prior to the visit, additional usage of staff PPE, and extensive cleaning  of exam room while observing appropriate contact time as indicated for disinfecting solutions.    Olean Ree, FNP-BC  [facility], [facility]  03/14/2019 9:38 AM

## 2019-03-14 NOTE — Patient Instructions (Signed)
Stop at lab  A resource that I like is www.dietdoctor.com/diabetes/diet The Fasting Method  Here are some guidelines to help you with meal planning -  Avoid all processed and packaged foods (bread, pasta, crackers, chips, etc) and beverages containing calories.  Avoid added sugars and excessive natural sugars.  Attention to how you feel if you consume artificial sweeteners.  Do they make you more hungry or raise your blood sugar?  With every meal and snack, aim to get 20 g of protein (3 ounces of meat, 4 ounces of fish, 3 eggs, protein powder, 1 cup [place] yogurt, 1 cup cottage cheese, etc.)  Increase fiber in the form of non-starchy vegetables.  These help you feel full with very little carbohydrates and are good for gut health.  Eat 1 serving healthy carb per meal- 1/2 cup brown rice, beans, potato, corn- pay attention to whether or not this significantly raises your blood sugar. If it does, reduce the frequency you consume these.   Eat 2-3 servings of lower sugar fruits daily.  This includes berries, apples, oranges, peaches, pears, one half banana.  Have small amounts of good fats such as avocado, nuts, olive oil, nut butters, olives.  Add a little cheese to your salads to make them tasty.

## 2019-03-15 MED ORDER — VITAMIN D3 1.25 MG (50000 UT) PO TABS: 1.0000 | ORAL_TABLET | ORAL | 3 refills | Status: DC

## 2019-03-15 NOTE — Addendum Note (Signed)
Addended by: Clarene Reamer B on: 03/15/2019 10:21 AM   Modules accepted: Orders

## 2019-03-24 DIAGNOSIS — Z20828 Contact with and (suspected) exposure to other viral communicable diseases: Secondary | ICD-10-CM | POA: Diagnosis not present

## 2019-03-24 DIAGNOSIS — U071 COVID-19: Secondary | ICD-10-CM | POA: Diagnosis not present

## 2019-06-10 DIAGNOSIS — M542 Cervicalgia: Secondary | ICD-10-CM | POA: Diagnosis not present

## 2019-06-10 DIAGNOSIS — Z981 Arthrodesis status: Secondary | ICD-10-CM | POA: Diagnosis not present

## 2019-06-10 DIAGNOSIS — M961 Postlaminectomy syndrome, not elsewhere classified: Secondary | ICD-10-CM | POA: Diagnosis not present

## 2019-06-28 DIAGNOSIS — Z23 Encounter for immunization: Secondary | ICD-10-CM | POA: Diagnosis not present

## 2019-07-26 DIAGNOSIS — Z23 Encounter for immunization: Secondary | ICD-10-CM | POA: Diagnosis not present

## 2019-08-25 DIAGNOSIS — M79672 Pain in left foot: Secondary | ICD-10-CM | POA: Diagnosis not present

## 2019-08-25 DIAGNOSIS — M25572 Pain in left ankle and joints of left foot: Secondary | ICD-10-CM | POA: Diagnosis not present

## 2019-09-06 DIAGNOSIS — M25572 Pain in left ankle and joints of left foot: Secondary | ICD-10-CM | POA: Diagnosis not present

## 2019-09-14 ENCOUNTER — Ambulatory Visit: Payer: BC Managed Care – PPO | Admitting: Family Medicine

## 2019-09-14 DIAGNOSIS — M25372 Other instability, left ankle: Secondary | ICD-10-CM | POA: Diagnosis not present

## 2019-09-16 ENCOUNTER — Other Ambulatory Visit: Payer: Self-pay | Admitting: Family Medicine

## 2019-09-16 DIAGNOSIS — F419 Anxiety disorder, unspecified: Secondary | ICD-10-CM

## 2019-09-16 DIAGNOSIS — F32A Depression, unspecified: Secondary | ICD-10-CM

## 2019-09-16 NOTE — Telephone Encounter (Signed)
Upcoming OV 09/19/19  Last refill 03/09/19 #90 x 1 refill  Please advise, thanks.

## 2019-09-19 ENCOUNTER — Ambulatory Visit: Payer: BC Managed Care – PPO | Admitting: Family Medicine

## 2019-09-28 ENCOUNTER — Other Ambulatory Visit: Payer: Self-pay

## 2019-09-28 ENCOUNTER — Encounter: Payer: Self-pay | Admitting: Family Medicine

## 2019-09-28 ENCOUNTER — Ambulatory Visit: Payer: BC Managed Care – PPO | Admitting: Family Medicine

## 2019-09-28 VITALS — BP 104/62 | HR 67 | Temp 97.8°F | Ht 65.0 in | Wt 187.4 lb

## 2019-09-28 DIAGNOSIS — R5382 Chronic fatigue, unspecified: Secondary | ICD-10-CM | POA: Diagnosis not present

## 2019-09-28 DIAGNOSIS — E669 Obesity, unspecified: Secondary | ICD-10-CM

## 2019-09-28 NOTE — Progress Notes (Signed)
Subjective:    Patient ID: Ruth Blevins, female    DOB: 1980/05/22, 39 y.o.   MRN: [id number]  HPI Chief Complaint  Patient presents with  . Follow-up    Pt states that she is always tired, not improved   Getting 8 hours of sleep a night. Always feels tired. Has been snoring some, no apnea. High stress. 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 More driving her current position, at least 4 hours a day.  Drinking [employer] vanilla latte with almond milk, eating breakfast burrito.  Will then eat later lunch like [employer] or [employer] salad.  Dinner is meat, starch, vegetable. Both of her children play competitive sports.  She is struggling to say no when it comes to taking cake orders.  She loves doing the baking but gets very stressed out when people have unrealistic expectations. Pain in left foot, has seen ortho and got a brace. Not doing PT exercises.   Review of Systems    No chest pain, shortness of breath, abdominal pain, diarrhea/constipation Objective:   Physical Exam Vitals reviewed.  Constitutional:      General: She is not in acute distress.    Appearance: Normal appearance. She is obese. She is not ill-appearing, toxic-appearing or diaphoretic.  HENT:     Head: Normocephalic and atraumatic.  Cardiovascular:     Rate and Rhythm: Normal rate.  Pulmonary:     Effort: Pulmonary effort is normal.  Neurological:     Mental Status: She is alert and oriented to person, place, and time.  Psychiatric:        Mood and Affect: Mood normal.        Behavior: Behavior normal.        Thought Content: Thought content normal.        Judgment: Judgment normal.       BP 104/62 (BP Location: Left Arm, Patient Position: Sitting, Cuff Size: Normal)   Pulse 67   Temp 97.8 F (36.6 C) (Temporal)   Ht 5\' 5"  (1.651 m)   Wt 187 lb 6.4 oz (85 kg)   SpO2 98%   BMI 31.18 kg/m  Wt Readings from Last 3 Encounters:  09/28/19 187 lb 6.4 oz (85 kg)  03/14/19 179 lb (81.2 kg)    07/28/18 180 lb (81.6 kg)       Assessment & Plan:  1. Chronic fatigue -Discussed patient's activity level, diet, stress level, lack of exercise -She has had normal labs in the last year except for low vitamin D which she is supplementing. -Discussed effects of stress and sleep on energy and attempts at weight loss.  Also discussed eating as field for the body and subbing out some of her higher carbohydrate, high sugar regular foods for healthy alternatives -Discussed ways that she can set limits to free up some time for herself so that she can do more self-care and regular exercise -Provided information regarding therapy and encouraged her to consider  2. Obesity, Class I, BMI 30-34.9 -Provided verbal and written information regarding healthy food choices, balanced diet, eating for energy -I have asked her to make 1 change a week for the next several weeks and to touch base with me via MyChart to see how she is doing if there is improvement in how she is feeling   This visit occurred during the SARS-CoV-2 public health emergency.  Safety protocols were in place, including screening questions prior to the visit, additional usage of staff PPE, and  extensive cleaning of exam room while observing appropriate contact time as indicated for disinfecting solutions.      Clarene Reamer, FNP-BC  [facility], [facility]  09/28/2019 5:58 PM

## 2019-09-28 NOTE — Patient Instructions (Addendum)
Start with your coffee- try premier protein caramel shake with coffee for an iced coffee. Work on this for a week. Then consider eating something different for breakfast- ? Hard boiled eggs, lower carb Vilma Meckel frozen sandwiches. Look at nurtrition information and work on minimizing carbs.   Send me a mychart message in 2 weeks- what changes have your made? How are you doing? What are your barriers?   A resource that I like is www.dietdoctor.com/diabetes/diet  Look for trends with the foods you are eating and be a scientist of your body.  Here are some guidelines to help you with meal planning -  Avoid all processed and packaged foods (bread, pasta, crackers, chips, etc) and beverages containing calories.  Avoid added sugars and excessive natural sugars.  Attention to how you feel if you consume artificial sweeteners.  Do they make you more hungry or raise your blood sugar?  With every meal and snack, aim to get 20 g of protein (3 ounces of meat, 4 ounces of fish, 3 eggs, protein powder, 1 cup [place] yogurt, 1 cup cottage cheese, etc.)  Increase fiber in the form of non-starchy vegetables.  These help you feel full with very little carbohydrates and are good for gut health.  Eat 1 serving healthy carb per meal- 1/2 cup brown rice, beans, potato, corn- pay attention to whether or not this significantly raises your blood sugar. If it does, reduce the frequency you consume these.   Eat 2-3 servings of lower sugar fruits daily.  This includes berries, apples, oranges, peaches, pears, one half banana.  Have small amounts of good fats such as avocado, nuts, olive oil, nut butters, olives.  Add a little cheese to your salads to make them tasty.

## 2019-10-04 ENCOUNTER — Encounter: Payer: Self-pay | Admitting: Family Medicine

## 2019-10-07 ENCOUNTER — Other Ambulatory Visit: Payer: Self-pay | Admitting: Family Medicine

## 2019-10-07 DIAGNOSIS — L249 Irritant contact dermatitis, unspecified cause: Secondary | ICD-10-CM

## 2019-10-07 MED ORDER — PREDNISONE 10 MG PO TABS
ORAL_TABLET | ORAL | 0 refills | Status: DC
Start: 2019-10-07 — End: 2019-10-24

## 2019-10-14 ENCOUNTER — Other Ambulatory Visit: Payer: Self-pay | Admitting: Family Medicine

## 2019-10-14 DIAGNOSIS — L249 Irritant contact dermatitis, unspecified cause: Secondary | ICD-10-CM

## 2019-10-14 MED ORDER — BETAMETHASONE VALERATE 0.1 % EX OINT: 1.0000 "application " | TOPICAL_OINTMENT | Freq: Two times a day (BID) | CUTANEOUS | 0 refills | Status: DC

## 2019-10-24 ENCOUNTER — Encounter: Payer: Self-pay | Admitting: Family Medicine

## 2019-10-24 ENCOUNTER — Ambulatory Visit: Payer: BC Managed Care – PPO | Admitting: Family Medicine

## 2019-10-24 ENCOUNTER — Ambulatory Visit (INDEPENDENT_AMBULATORY_CARE_PROVIDER_SITE_OTHER)
Admission: RE | Admit: 2019-10-24 | Discharge: 2019-10-24 | Disposition: A | Discharge status: Home / Self Care | Payer: BC Managed Care – PPO | Source: Ambulatory Visit | Attending: Family Medicine | Admitting: Family Medicine

## 2019-10-24 ENCOUNTER — Other Ambulatory Visit: Payer: Self-pay

## 2019-10-24 VITALS — BP 118/80 | HR 65 | Temp 97.7°F | Ht 65.0 in | Wt 184.1 lb

## 2019-10-24 DIAGNOSIS — S6992XA Unspecified injury of left wrist, hand and finger(s), initial encounter: Secondary | ICD-10-CM | POA: Diagnosis not present

## 2019-10-24 DIAGNOSIS — M79642 Pain in left hand: Secondary | ICD-10-CM | POA: Diagnosis not present

## 2019-10-24 DIAGNOSIS — R21 Rash and other nonspecific skin eruption: Secondary | ICD-10-CM | POA: Diagnosis not present

## 2019-10-24 NOTE — Patient Instructions (Signed)
Resume your daily antihistamine  Continue steroid cream twice a day  I will notify you of xray results
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
## 2020-02-03 DIAGNOSIS — M5030 Other cervical disc degeneration, unspecified cervical region: Secondary | ICD-10-CM | POA: Insufficient documentation

## 2020-02-03 DIAGNOSIS — J029 Acute pharyngitis, unspecified: Secondary | ICD-10-CM | POA: Diagnosis not present

## 2020-02-03 DIAGNOSIS — J018 Other acute sinusitis: Secondary | ICD-10-CM | POA: Diagnosis not present

## 2020-02-08 DIAGNOSIS — J029 Acute pharyngitis, unspecified: Secondary | ICD-10-CM | POA: Diagnosis not present

## 2020-02-08 DIAGNOSIS — Z8616 Personal history of COVID-19: Secondary | ICD-10-CM | POA: Diagnosis not present

## 2020-02-08 DIAGNOSIS — R0981 Nasal congestion: Secondary | ICD-10-CM | POA: Diagnosis not present

## 2020-02-08 DIAGNOSIS — R059 Cough, unspecified: Secondary | ICD-10-CM | POA: Diagnosis not present

## 2020-04-26 DIAGNOSIS — Z20828 Contact with and (suspected) exposure to other viral communicable diseases: Secondary | ICD-10-CM | POA: Diagnosis not present

## 2020-06-05 ENCOUNTER — Other Ambulatory Visit: Payer: Self-pay | Admitting: *Deleted

## 2020-06-05 DIAGNOSIS — F419 Anxiety disorder, unspecified: Secondary | ICD-10-CM

## 2020-06-05 DIAGNOSIS — F32A Depression, unspecified: Secondary | ICD-10-CM

## 2020-06-05 NOTE — Telephone Encounter (Signed)
Last office visit 10/24/2019 with D. Leone Payor Rash and Hand Injury.  Last refilled 09/16/2019 for #90 with 1 refill.   No TOC appointment.

## 2020-06-06 MED ORDER — ESCITALOPRAM OXALATE 20 MG PO TABS: 20.0000 mg | ORAL_TABLET | Freq: Every day | ORAL | 0 refills | Status: DC

## 2020-06-06 NOTE — Telephone Encounter (Signed)
Sent. Thanks.  Needs TOC when possible this spring.

## 2020-06-12 NOTE — Telephone Encounter (Addendum)
Pt requesting refill lexapro; advised pt lexapro 20 mg # 90 was sent to [facility] in [facility] on 06/06/20. By Dr Para March. Pt will pick up med.Pt wanted to know if could leave message for Harlin Heys FNP and I advised she is no longer with practice and someone will be calling for [facility] later this spring; pt said OK and will wait for call. Sending note to [place] and Shanda Bumps CMA.

## 2020-08-06 ENCOUNTER — Telehealth: Payer: Self-pay

## 2020-08-06 NOTE — Telephone Encounter (Signed)
Called and left detailed voicemail (ok per DPR).

## 2020-08-06 NOTE — Telephone Encounter (Signed)
Patient called and stated that she was a patient of Deboraha Sprang, NP and has been treated for anxiety and depression. Patient stated that she recently adopted a dog and is wanting to know if she can get a letter for an emotional support animal. Please advise.

## 2020-08-10 NOTE — Telephone Encounter (Signed)
Pt called back as she did not receive the VM from Anisha on the 25th. Ruth Blevins has always prescribed her antidepressants for her. She needs a letter for her landlord that she will be renting from starting in June that this is her emotional support dog. The dog is a yorkie. The pt is going through a divorce and has recently lost her mother. They have a no pet policy unless they have proper documentation. Please advise if this can be done for her.

## 2020-08-14 NOTE — Telephone Encounter (Signed)
Patient stated that she does not see a therapist or a psychiatrist and is wanting to see if she can get a referral. Please advise.

## 2020-09-08 ENCOUNTER — Other Ambulatory Visit: Payer: Self-pay | Admitting: Family Medicine

## 2020-09-08 DIAGNOSIS — F419 Anxiety disorder, unspecified: Secondary | ICD-10-CM

## 2020-09-08 DIAGNOSIS — F32A Depression, unspecified: Secondary | ICD-10-CM

## 2020-09-21 ENCOUNTER — Other Ambulatory Visit: Payer: Self-pay | Admitting: Family

## 2020-09-21 DIAGNOSIS — F32A Depression, unspecified: Secondary | ICD-10-CM

## 2020-12-10 ENCOUNTER — Other Ambulatory Visit: Payer: Self-pay

## 2020-12-10 ENCOUNTER — Encounter: Payer: Self-pay | Admitting: Family Medicine

## 2020-12-10 ENCOUNTER — Ambulatory Visit: Payer: BC Managed Care – PPO | Admitting: Family Medicine

## 2020-12-10 DIAGNOSIS — M25532 Pain in left wrist: Secondary | ICD-10-CM | POA: Diagnosis not present

## 2020-12-10 DIAGNOSIS — F419 Anxiety disorder, unspecified: Secondary | ICD-10-CM | POA: Diagnosis not present

## 2020-12-10 DIAGNOSIS — M5030 Other cervical disc degeneration, unspecified cervical region: Secondary | ICD-10-CM

## 2020-12-10 DIAGNOSIS — F32A Depression, unspecified: Secondary | ICD-10-CM | POA: Diagnosis not present

## 2020-12-10 MED ORDER — ESCITALOPRAM OXALATE 20 MG PO TABS: ORAL_TABLET | ORAL | 0 refills | Status: DC

## 2020-12-10 MED ORDER — BUSPIRONE HCL 10 MG PO TABS: 10.0000 mg | ORAL_TABLET | Freq: Two times a day (BID) | ORAL | 1 refills | Status: AC | PRN

## 2020-12-10 MED ORDER — BUSPIRONE HCL 10 MG PO TABS: 10.0000 mg | ORAL_TABLET | Freq: Two times a day (BID) | ORAL | 1 refills | Status: DC | PRN

## 2020-12-10 NOTE — Assessment & Plan Note (Signed)
She has had improvement with Lexapro, continue at 20 mg daily.  Adding BuSpar 10 mg twice daily as needed.  I think there is some unaddressed grief related to the passing of her mother as well.  Given information on grief share as well as counseling available through hospice.  Follow-up in about 4 weeks.

## 2020-12-10 NOTE — Assessment & Plan Note (Addendum)
This seems to be more of a tendinitis.  We will treat conservatively with topical Voltaren and icing.  We can consider immobilization with wrist brace if needed.  We also discussed availability of Dr. Benjamin Stain.

## 2020-12-10 NOTE — Patient Instructions (Signed)
Very nice to meet you today! Try voltaren (diclofenac) gel on the wrist 3-4 times per day as needed.   Continue lexapro at 20mg  daily.  Add buspar 10mg  twice per day as needed for anxiety.  Look into griefshare.org  [facility] : Call the [facility] in [place] at [phone] or in [place] at [phone] during regular business hours and ask to speak with our bereavement team.
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
## 2020-12-10 NOTE — Assessment & Plan Note (Signed)
Chronic cervical pain.  She is status post fusion.  Recommend continued care with anti-inflammatories, icing and heat as needed.  No radicular symptoms at this time.

## 2021-01-07 DIAGNOSIS — Z79890 Hormone replacement therapy (postmenopausal): Secondary | ICD-10-CM | POA: Diagnosis not present

## 2021-01-07 DIAGNOSIS — R300 Dysuria: Secondary | ICD-10-CM | POA: Diagnosis not present

## 2021-01-07 DIAGNOSIS — Z79899 Other long term (current) drug therapy: Secondary | ICD-10-CM | POA: Diagnosis not present

## 2021-01-07 DIAGNOSIS — R109 Unspecified abdominal pain: Secondary | ICD-10-CM | POA: Diagnosis not present

## 2021-01-07 DIAGNOSIS — R319 Hematuria, unspecified: Secondary | ICD-10-CM | POA: Diagnosis not present

## 2021-01-07 DIAGNOSIS — Z975 Presence of (intrauterine) contraceptive device: Secondary | ICD-10-CM | POA: Diagnosis not present

## 2021-01-07 DIAGNOSIS — N10 Acute tubulo-interstitial nephritis: Secondary | ICD-10-CM | POA: Diagnosis not present

## 2021-01-07 DIAGNOSIS — N12 Tubulo-interstitial nephritis, not specified as acute or chronic: Secondary | ICD-10-CM | POA: Diagnosis not present

## 2021-01-07 DIAGNOSIS — R1032 Left lower quadrant pain: Secondary | ICD-10-CM | POA: Diagnosis not present

## 2021-01-07 DIAGNOSIS — Z888 Allergy status to other drugs, medicaments and biological substances status: Secondary | ICD-10-CM | POA: Diagnosis not present

## 2021-01-15 ENCOUNTER — Ambulatory Visit: Payer: BC Managed Care – PPO | Admitting: Family Medicine

## 2021-03-27 ENCOUNTER — Other Ambulatory Visit: Payer: Self-pay

## 2021-03-27 DIAGNOSIS — F419 Anxiety disorder, unspecified: Secondary | ICD-10-CM

## 2021-03-27 MED ORDER — ESCITALOPRAM OXALATE 20 MG PO TABS: ORAL_TABLET | ORAL | 0 refills | Status: DC

## 2021-03-27 NOTE — Progress Notes (Signed)
Patient has been scheduled with Dr Ashley Royalty for 8 am on 05/01/2021.

## 2021-03-27 NOTE — Progress Notes (Unsigned)
Please contact pt to schedule follow-up for GAD with Dr. Ashley Royalty. Per note f/u was due in September.   Thanks

## 2021-04-02 ENCOUNTER — Ambulatory Visit: Payer: BC Managed Care – PPO | Admitting: Family Medicine

## 2021-04-02 ENCOUNTER — Encounter: Payer: Self-pay | Admitting: Family Medicine

## 2021-04-02 ENCOUNTER — Other Ambulatory Visit: Payer: Self-pay

## 2021-04-02 VITALS — BP 108/75 | HR 78 | Temp 98.6°F | Ht 65.0 in | Wt 174.1 lb

## 2021-04-02 DIAGNOSIS — H5789 Other specified disorders of eye and adnexa: Secondary | ICD-10-CM

## 2021-04-02 DIAGNOSIS — R6889 Other general symptoms and signs: Secondary | ICD-10-CM | POA: Diagnosis not present

## 2021-04-02 LAB — POCT INFLUENZA A/B
Influenza A, POC: NEGATIVE
Influenza B, POC: NEGATIVE

## 2021-04-02 MED ORDER — ERYTHROMYCIN 5 MG/GM OP OINT: 1.0000 "application " | TOPICAL_OINTMENT | Freq: Four times a day (QID) | OPHTHALMIC | 0 refills | Status: AC

## 2021-04-02 NOTE — Patient Instructions (Signed)
-  Antibiotic eye ointment for eyelid infection.    Over the counter medications that may be helpful for symptoms:  Guaifenesin 1200 mg extended release tabs twice daily, with plenty of water For cough and congestion Brand name: Mucinex   Pseudoephedrine 30 mg, one or two tabs every 4 to 6 hours For sinus congestion Brand name: Sudafed You must get this from the pharmacy counter.  Oxymetazoline nasal spray each morning, one spray in each nostril, for NO MORE THAN 3 days  For nasal and sinus congestion Brand name: Afrin Saline nasal spray or Saline Nasal Irrigation 3-5 times a day For nasal and sinus congestion Brand names: Ocean or AYR Fluticasone nasal spray, one spray in each nostril, each morning (after oxymetazoline and saline, if used) For nasal and sinus congestion Brand name: Flonase Warm salt water gargles  For sore throat Every few hours as needed Alternate ibuprofen 400-600 mg and acetaminophen 1000 mg every 4-6 hours For fever, body aches, headache Brand names: Motrin or Advil and Tylenol Dextromethorphan 12-hour cough version 30 mg every 12 hours  For cough Brand name: Delsym Stop all other cold medications for now (Nyquil, Dayquil, Tylenol Cold, Theraflu, etc) and other non-prescription cough/cold preparations. Many of these have the same ingredients listed above and could cause an overdose of medication.   Herbal treatments that have been shown to be helpful in some patients include: Vitamin C 1000mg  per day Vitamin D 4000iU per day Zinc 100mg  per day Quercetin 25-500mg  twice a day Melatonin 5-10mg  at bedtime  General Instructions Allow your body to rest Drink PLENTY of fluids Isolate yourself from everyone, even family, until test results have returned  If your COVID-19 test is positive Then you ARE INFECTED and you can pass the virus to others You must quarantine from others for a minimum of  5 days since symptoms started AND You are fever free for 24  hours WITHOUT any medication to reduce fever AND Your symptoms are improving Wear mask for the next 5 days If not improved by day 5, quarantine for the full 10 days. Do not go to the store or other public areas Do not go around household members who are not known to be infected with COVID-19 If you MUST leave your area of quarantine (example: go to a bathroom you share with others in your home), you must Wear a mask Wash your hands thoroughly Wipe down any surfaces you touch Do not share food, drinks, towels, or other items with other persons Dispose of your own tissues, food containers, etc  Once you have recovered, please continue good preventive care measures, including:  wearing a mask when in public wash your hands frequently avoid touching your face/nose/eyes cover coughs/sneezes with the inside of your elbow stay out of crowds keep a 6 foot distance from others  If you develop severe shortness of breath, uncontrolled fevers, coughing up blood, confusion, chest pain, or signs of dehydration (such as significantly decreased urine amounts or dizziness with standing) please go to the ER.

## 2021-04-02 NOTE — Progress Notes (Signed)
Acute Office Visit  Subjective:    Patient ID: Ruth Blevins, female    DOB: Dec 09, 1980, 40 y.o.   MRN: [id number]  Chief Complaint  Patient presents with   Conjunctivitis    Left eye x - 3days   Flu-like symptoms     Patient is in today for left eye irritation.   Three days ago, patient noticed her left eye was burning/itching, swollen, red.  Within an hour she states that her eye felt swollen shut.  She reports the majority of the symptoms were the upper eyelid area.  States she has green "goopy" drainage from the right eye that is crusty when she wakes up.  Reports she has been doing warm compresses and found over-the-counter natural pinkeye reliever eyedrops which helped with the burning but other symptoms remained.  She denies any pain, vision changes, redness to her sclera/conjunctiva.  Additionally she also notes that today she woke up with a sore throat, coughing, sneezing, diarrhea.  States she just feels a little achy.  She denies any fevers, body aches, fatigue, sinus pain, headaches.     Past Medical History:  Diagnosis Date   Degenerative disc disease    Frequent headaches    History of chicken pox    Inguinal hernia    UTI (lower urinary tract infection)     Past Surgical History:  Procedure Laterality Date   CERVICAL FUSION     Neck fusion, C5 & C6  2010   TONSILLECTOMY Bilateral 2002    Family History  Problem Relation Age of Onset   Arthritis Mother    Asthma Mother    Cancer Mother    Breast cancer Mother 76       Right Lumpectomy 11/26/2016   Depression Mother    Suicidality Mother    Arthritis Maternal Grandmother    Asthma Maternal Grandmother    Lung cancer Maternal Grandmother    Leukemia Paternal Grandmother    Cancer Paternal Grandfather     Social History   Socioeconomic History   Marital status: Legally Separated    Spouse name: Not on file   Number of children: Not on file   Years of education: Not on file   Highest  education level: Not on file  Occupational History   Occupation: Civil Service fast streamer    Comment: [employer]  Tobacco Use   Smoking status: Former    Packs/day: 1.00    Years: 10.00    Pack years: 10.00    Types: Cigarettes    Quit date: 04/14/2006    Years since quitting: 14.9   Smokeless tobacco: Current    Types: Snuff  Vaping Use   Vaping Use: Every day   Substances: Nicotine  Substance and Sexual Activity   Alcohol use: Yes    Comment: socially wine   Drug use: No   Sexual activity: Yes    Birth control/protection: None, I.U.D.    Comment: Mirena  Other Topics Concern   Not on file  Social History Narrative   She is married with a daughter (2005) and son (2014).    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    Social Determinants of Health   Financial Resource Strain: Not on file  Food Insecurity: Not on file  Transportation Needs: Not on file  Physical Activity: Not on file  Stress: Not on file  Social Connections: Not on file  Intimate Partner Violence: Not on file  Outpatient Medications Prior to Visit  Medication Sig Dispense Refill   busPIRone (BUSPAR) 10 MG tablet Take 1 tablet (10 mg total) by mouth 2 (two) times daily as needed. 60 tablet 1   escitalopram (LEXAPRO) 20 MG tablet TAKE 1 TABLET(20 MG) BY MOUTH DAILY 90 tablet 0   levonorgestrel (MIRENA) 20 MCG/24HR IUD Mirena 20 mcg/24 hr (5 years) intrauterine device  Take by intrauterine route., Needs to be removed 11/2017     loratadine (CLARITIN) 10 MG tablet Take 1 tablet by mouth at bedtime as needed.     No facility-administered medications prior to visit.    Allergies  Allergen Reactions   Trazodone And Nefazodone Other (See Comments)    Stomach cramps   Cyclobenzaprine Palpitations   Metaxalone Other (See Comments), Anxiety and Palpitations   Tramadol Rash    Review of Systems All review of systems negative except what is listed in the  HPI     Objective:    Physical Exam Constitutional:      Appearance: Normal appearance. She is normal weight.  HENT:     Right Ear: Tympanic membrane normal.     Left Ear: Tympanic membrane normal.  Eyes:     General:        Right eye: No discharge.        Left eye: No discharge.     Extraocular Movements: Extraocular movements intact.     Conjunctiva/sclera: Conjunctivae normal.     Comments: left upper lid with erythema, edema, no obvious hordeolum/chalazion  Cardiovascular:     Rate and Rhythm: Normal rate and regular rhythm.     Heart sounds: Normal heart sounds.  Pulmonary:     Effort: Pulmonary effort is normal.     Breath sounds: Normal breath sounds.  Musculoskeletal:     Cervical back: Normal range of motion and neck supple.  Skin:    General: Skin is warm and dry.  Neurological:     Mental Status: She is alert.  Psychiatric:        Mood and Affect: Mood normal.        Behavior: Behavior normal.        Thought Content: Thought content normal.        Judgment: Judgment normal.    BP 108/75 (BP Location: Left Arm, Patient Position: Sitting, Cuff Size: Normal)    Pulse 78    Temp 98.6 F (37 C) (Oral)    Ht 5\' 5"  (1.651 m)    Wt 174 lb 1.9 oz (79 kg)    SpO2 95%    BMI 28.98 kg/m  Wt Readings from Last 3 Encounters:  04/02/21 174 lb 1.9 oz (79 kg)  12/10/20 174 lb 8 oz (79.2 kg)  10/24/19 184 lb 1.9 oz (83.5 kg)    Health Maintenance Due  Topic Date Due   Hepatitis C Screening  Never done   PAP SMEAR-Modifier  12/25/2019    There are no preventive care reminders to display for this patient.   Lab Results  Component Value Date   TSH 2.58 03/14/2019   Lab Results  Component Value Date   WBC 6.3 07/28/2018   HGB 12.8 07/28/2018   HCT 37.0 07/28/2018   MCV 93.0 07/28/2018   PLT 177.0 07/28/2018   Lab Results  Component Value Date   NA 140 07/28/2018   K 4.2 07/28/2018   CO2 27 07/28/2018   GLUCOSE 89 07/28/2018   BUN 10 07/28/2018    CREATININE 0.60 07/28/2018  BILITOT 0.4 07/28/2018   ALKPHOS 58 07/28/2018   AST 21 07/28/2018   ALT 17 07/28/2018   PROT 7.1 07/28/2018   ALBUMIN 4.3 07/28/2018   CALCIUM 9.1 07/28/2018   GFR 112.88 07/28/2018   No results found for: CHOL No results found for: HDL No results found for: LDLCALC No results found for: TRIG No results found for: [facility] Lab Results  Component Value Date   HGBA1C 5.5 07/28/2018       Assessment & Plan:   1. Flu-like symptoms Flu negative.  Recommend she take a COVID test today or tomorrow for more accurate results, especially if she plans to get around people for Christmas.  Continue supportive measures including rest, hydration, humidifier use, steam showers, warm compresses to sinuses, warm liquids with lemon and honey, and over-the-counter cough, cold, and analgesics as needed.    - POCT Influenza A/B  2. Irritation of left eye Left upper lid with erythema and edema. No obvious stye or chalazion present. She cleaned eye really well prior to appointment so no visible discharge, but given her description consider blepharitis vs early stye/chalazion. Given the level of discomfort, will go ahead and treat with antibiotic ointment. Continue warm compresses and good hygiene. Patient aware of signs/symptoms requiring further/urgent evaluation.   Please contact office for follow-up if symptoms do not improve or worsen. Seek emergency care if symptoms become severe.   Lollie Marrow Reola Calkins, DNP, FNP-C

## 2021-04-05 ENCOUNTER — Telehealth (INDEPENDENT_AMBULATORY_CARE_PROVIDER_SITE_OTHER): Payer: BC Managed Care – PPO | Admitting: Family Medicine

## 2021-04-05 ENCOUNTER — Encounter: Payer: Self-pay | Admitting: Family Medicine

## 2021-04-05 DIAGNOSIS — J0110 Acute frontal sinusitis, unspecified: Secondary | ICD-10-CM

## 2021-04-05 MED ORDER — BENZONATATE 200 MG PO CAPS: 200.0000 mg | ORAL_CAPSULE | Freq: Two times a day (BID) | ORAL | 0 refills | Status: DC | PRN

## 2021-04-05 MED ORDER — AMOXICILLIN-POT CLAVULANATE 875-125 MG PO TABS: 1.0000 | ORAL_TABLET | Freq: Two times a day (BID) | ORAL | 0 refills | Status: DC

## 2021-04-05 MED ORDER — PREDNISONE 20 MG PO TABS: 20.0000 mg | ORAL_TABLET | Freq: Every day | ORAL | 0 refills | Status: AC

## 2021-04-05 NOTE — Progress Notes (Signed)
Virtual Video Visit via MyChart Note  I connected with  Ruth Blevins on 04/05/21 at  3:00 PM EST by the video enabled telemedicine application for MyChart, and verified that I am speaking with the correct person using two identifiers.   I introduced myself as a Publishing rights manager with the practice. We discussed the limitations of evaluation and management by telemedicine and the availability of in person appointments. The patient expressed understanding and agreed to proceed.  Participating parties in this visit include: The patient and the nurse practitioner listed.  The patient is: At home I am: In the office - [facility]  Subjective:    CC:  Chief Complaint  Patient presents with   Cough   Nasal Congestion    HPI: Ruth Blevins is a 40 y.o. year old female presenting today via MyChart today for URI.  Patient reports that her mild cold like symptoms from earlier this week have worsened significantly. She hasn't really left the bed much since Monday. By Tuesday, she felt all the congestion settling into a productive cough. She has been having some wheezing at times as well along with a frontal headache. The cough has been keeping her from sleeping. She has not had any fevers, chest pain, dyspnea at rest, ear pain, nausea, vomiting, diarrhea. She has not been getting much relief with OTC cold medicines.      Past medical history, Surgical history, Family history not pertinant except as noted below, Social history, Allergies, and medications have been entered into the medical record, reviewed, and corrections made.   Review of Systems:  All review of systems negative except what is listed in the HPI   Objective:    General:  Speaking clearly in complete sentences. Absent shortness of breath noted.   Alert and oriented x3.   Normal judgment.  Absent acute distress.   Impression and Recommendations:    1. Acute non-recurrent frontal sinusitis -  predniSONE (DELTASONE) 20 MG tablet; Take 1 tablet (20 mg total) by mouth daily with breakfast for 5 days.  Dispense: 5 tablet; Refill: 0 - benzonatate (TESSALON) 200 MG capsule; Take 1 capsule (200 mg total) by mouth 2 (two) times daily as needed for cough.  Dispense: 20 capsule; Refill: 0 - amoxicillin-clavulanate (AUGMENTIN) 875-125 MG tablet; Take 1 tablet by mouth 2 (two) times daily for 7 days.  Dispense: 14 tablet; Refill: 0  Starting some low dose prednisone for her wheezing and severity of symptoms. Educated that this could still be a viral infection. Recommend she try a few more days of conservative measures before starting ABX - sending in as a "watch and wait" script since we are going into a long holiday weekend. Continue supportive measures including rest, hydration, humidifier use, steam showers, warm compresses to sinuses, warm liquids with lemon and honey, and over-the-counter cough, cold, and analgesics as needed.  Patient aware of signs/symptoms requiring further/urgent evaluation.     Follow-up if symptoms worsen or fail to improve.    I discussed the assessment and treatment plan with the patient. The patient was provided an opportunity to ask questions and all were answered. The patient agreed with the plan and demonstrated an understanding of the instructions.   The patient was advised to call back or seek an in-person evaluation if the symptoms worsen or if the condition fails to improve as anticipated.  I spent 20 minutes dedicated to the care of this patient on the date of this encounter to include pre-visit  chart review of prior notes and results, face-to-face time with the patient, and post-visit ordering of testing as indicated.   Clayborne Dana, NP

## 2021-04-12 ENCOUNTER — Other Ambulatory Visit: Payer: Self-pay

## 2021-04-12 ENCOUNTER — Encounter: Payer: Self-pay | Admitting: Family Medicine

## 2021-04-12 ENCOUNTER — Ambulatory Visit (INDEPENDENT_AMBULATORY_CARE_PROVIDER_SITE_OTHER): Payer: BC Managed Care – PPO

## 2021-04-12 ENCOUNTER — Ambulatory Visit: Payer: BC Managed Care – PPO | Admitting: Family Medicine

## 2021-04-12 VITALS — BP 138/68 | HR 69 | Temp 98.0°F | Ht 65.0 in | Wt 174.0 lb

## 2021-04-12 DIAGNOSIS — R059 Cough, unspecified: Secondary | ICD-10-CM | POA: Diagnosis not present

## 2021-04-12 DIAGNOSIS — J209 Acute bronchitis, unspecified: Secondary | ICD-10-CM

## 2021-04-12 DIAGNOSIS — R0989 Other specified symptoms and signs involving the circulatory and respiratory systems: Secondary | ICD-10-CM

## 2021-04-12 DIAGNOSIS — Z981 Arthrodesis status: Secondary | ICD-10-CM | POA: Diagnosis not present

## 2021-04-12 MED ORDER — DOXYCYCLINE HYCLATE 100 MG PO TABS: 100.0000 mg | ORAL_TABLET | Freq: Two times a day (BID) | ORAL | 0 refills | Status: AC

## 2021-04-12 MED ORDER — METHYLPREDNISOLONE SODIUM SUCC 125 MG IJ SOLR
125.0000 mg | Freq: Once | INTRAMUSCULAR | Status: AC
Start: 2021-04-12 — End: 2021-04-12
  Administered 2021-04-12: 12:00:00 125 mg via INTRAMUSCULAR

## 2021-04-12 NOTE — Patient Instructions (Signed)
Continue with increased fluids.  Humidifier in home may help.  Delsym is usually more effective than other OTC cough medications.  Complete augmentin and add doxycycline.   We'll be in touch with Chest xray results.

## 2021-04-15 ENCOUNTER — Encounter: Payer: Self-pay | Admitting: Family Medicine

## 2021-04-15 DIAGNOSIS — J209 Acute bronchitis, unspecified: Secondary | ICD-10-CM | POA: Insufficient documentation

## 2021-04-15 NOTE — Progress Notes (Signed)
Ruth Blevins - 41 y.o. female MRN [id number]  Date of birth: Aug 21, 1980  Subjective   HPI Ruth Blevins is a 41 year old female here today with complaint cough and congestion.  Initially seen on 04/03/2019 with flulike symptoms.  Home COVID test was negative x2.  Flu test in clinic was negative.  Recommended conservative care at that time.  Return on 04/05/2021 given prescription for prednisone, Tessalon and Augmentin.  Since that time she has had continued worsening of symptoms with harsh cough, intermittent wheezing and some dyspnea.  She has not had further fever, nausea, vomiting or diarrhea.  ROS:  A comprehensive ROS was completed and negative except as noted per HPI  Allergies  Allergen Reactions   Trazodone And Nefazodone Other (See Comments)    Stomach cramps   Cyclobenzaprine Palpitations   Metaxalone Other (See Comments), Anxiety and Palpitations   Tramadol Rash    Past Medical History:  Diagnosis Date   Degenerative disc disease    Frequent headaches    History of chicken pox    Inguinal hernia    UTI (lower urinary tract infection)     Past Surgical History:  Procedure Laterality Date   CERVICAL FUSION     Neck fusion, C5 & C6  2010   TONSILLECTOMY Bilateral 2002    Social History   Socioeconomic History   Marital status: Legally Separated    Spouse name: Not on file   Number of children: Not on file   Years of education: Not on file   Highest education level: Not on file  Occupational History   Occupation: Civil Service fast streamer    Comment: [employer]  Tobacco Use   Smoking status: Former    Packs/day: 1.00    Years: 10.00    Pack years: 10.00    Types: Cigarettes    Quit date: 04/14/2006    Years since quitting: 15.0   Smokeless tobacco: Current    Types: Snuff  Vaping Use   Vaping Use: Every day   Substances: Nicotine  Substance and Sexual Activity   Alcohol use: Yes    Comment: socially wine   Drug use: No   Sexual activity: Yes    Birth  control/protection: None, I.U.D.    Comment: Mirena  Other Topics Concern   Not on file  Social History Narrative   She is married with a daughter (2005) and son (2014).    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    Social Determinants of Health   Financial Resource Strain: Not on file  Food Insecurity: Not on file  Transportation Needs: Not on file  Physical Activity: Not on file  Stress: Not on file  Social Connections: Not on file    Family History  Problem Relation Age of Onset   Arthritis Mother    Asthma Mother    Cancer Mother    Breast cancer Mother 5       Right Lumpectomy 11/26/2016   Depression Mother    Suicidality Mother    Arthritis Maternal Grandmother    Asthma Maternal Grandmother    Lung cancer Maternal Grandmother    Leukemia Paternal Grandmother    Cancer Paternal Grandfather     Health Maintenance  Topic Date Due   Pneumococcal Vaccine 21-29 Years old (1 - PCV) Never done   Hepatitis C Screening  Never done   PAP SMEAR-Modifier  12/25/2019   COVID-19 Vaccine (3 - Booster for Moderna series) 04/18/2021 (  Originally 09/20/2019)   INFLUENZA VACCINE  07/12/2021 (Originally 11/12/2020)   TETANUS/TDAP  07/28/2022   HIV Screening  Completed   HPV VACCINES  Aged Out     ----------------------------------------------------------------------------------------------------------------------------------------------------------------------------------------------------------------- Physical Exam BP 138/68 (BP Location: Left Arm, Patient Position: Sitting, Cuff Size: Normal)    Pulse 69    Temp 98 F (36.7 C) (Oral)    Ht 5\' 5"  (1.651 m)    Wt 174 lb 0.6 oz (78.9 kg)    SpO2 98%    BMI 28.96 kg/m   Physical Exam Constitutional:      Appearance: Normal appearance.  Eyes:     General: No scleral icterus. Cardiovascular:     Rate and Rhythm: Normal rate and regular rhythm.  Pulmonary:     Effort: Pulmonary  effort is normal.     Comments: Slightly diminished breath sounds in the right base. Musculoskeletal:     Cervical back: Neck supple.  Neurological:     General: No focal deficit present.     Mental Status: She is alert.  Psychiatric:        Mood and Affect: Mood normal.        Behavior: Behavior normal.    ------------------------------------------------------------------------------------------------------------------------------------------------------------------------------------------------------------------- Assessment and Plan  Acute bronchitis Concern for possible pneumonia as well.  Chest x-ray ordered.  Adding doxycycline and given injection of Solu-Medrol in clinic today.  Recommended continuing increase fluids.  Humidifier in the home may be helpful as well.   Meds ordered this encounter  Medications   doxycycline (VIBRA-TABS) 100 MG tablet    Sig: Take 1 tablet (100 mg total) by mouth 2 (two) times daily for 10 days.    Dispense:  20 tablet    Refill:  0   methylPREDNISolone sodium succinate (SOLU-MEDROL) 125 mg/2 mL injection 125 mg    No follow-ups on file.    This visit occurred during the SARS-CoV-2 public health emergency.  Safety protocols were in place, including screening questions prior to the visit, additional usage of staff PPE, and extensive cleaning of exam room while observing appropriate contact time as indicated for disinfecting solutions.

## 2021-04-15 NOTE — Assessment & Plan Note (Signed)
Concern for possible pneumonia as well.  Chest x-ray ordered.  Adding doxycycline and given injection of Solu-Medrol in clinic today.  Recommended continuing increase fluids.  Humidifier in the home may be helpful as well.

## 2021-05-01 ENCOUNTER — Ambulatory Visit: Payer: BC Managed Care – PPO | Admitting: Family Medicine

## 2021-06-24 DIAGNOSIS — M542 Cervicalgia: Secondary | ICD-10-CM | POA: Diagnosis not present

## 2021-06-25 DIAGNOSIS — L03211 Cellulitis of face: Secondary | ICD-10-CM | POA: Diagnosis not present

## 2021-07-12 ENCOUNTER — Telehealth: Payer: Self-pay

## 2021-07-12 DIAGNOSIS — F32A Depression, unspecified: Secondary | ICD-10-CM

## 2021-07-12 MED ORDER — ESCITALOPRAM OXALATE 20 MG PO TABS: ORAL_TABLET | ORAL | 0 refills | Status: AC

## 2021-07-12 NOTE — Telephone Encounter (Signed)
LVM for patient to call back to get appt scheduled for refills of Lexapro. AM ?

## 2021-07-12 NOTE — Telephone Encounter (Signed)
Please call and schedule pt for medication follow-up for Lexapro.  ? ?Thanks ?
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
## 2023-08-02 IMAGING — DX DG CHEST 2V
2 series · 2 of 2 positions shown · non-contrast
Comparison: 10/22/2019

CLINICAL DATA: 40-year-old female with history of cough.

EXAM:
CHEST - 2 VIEW

[chest pa]
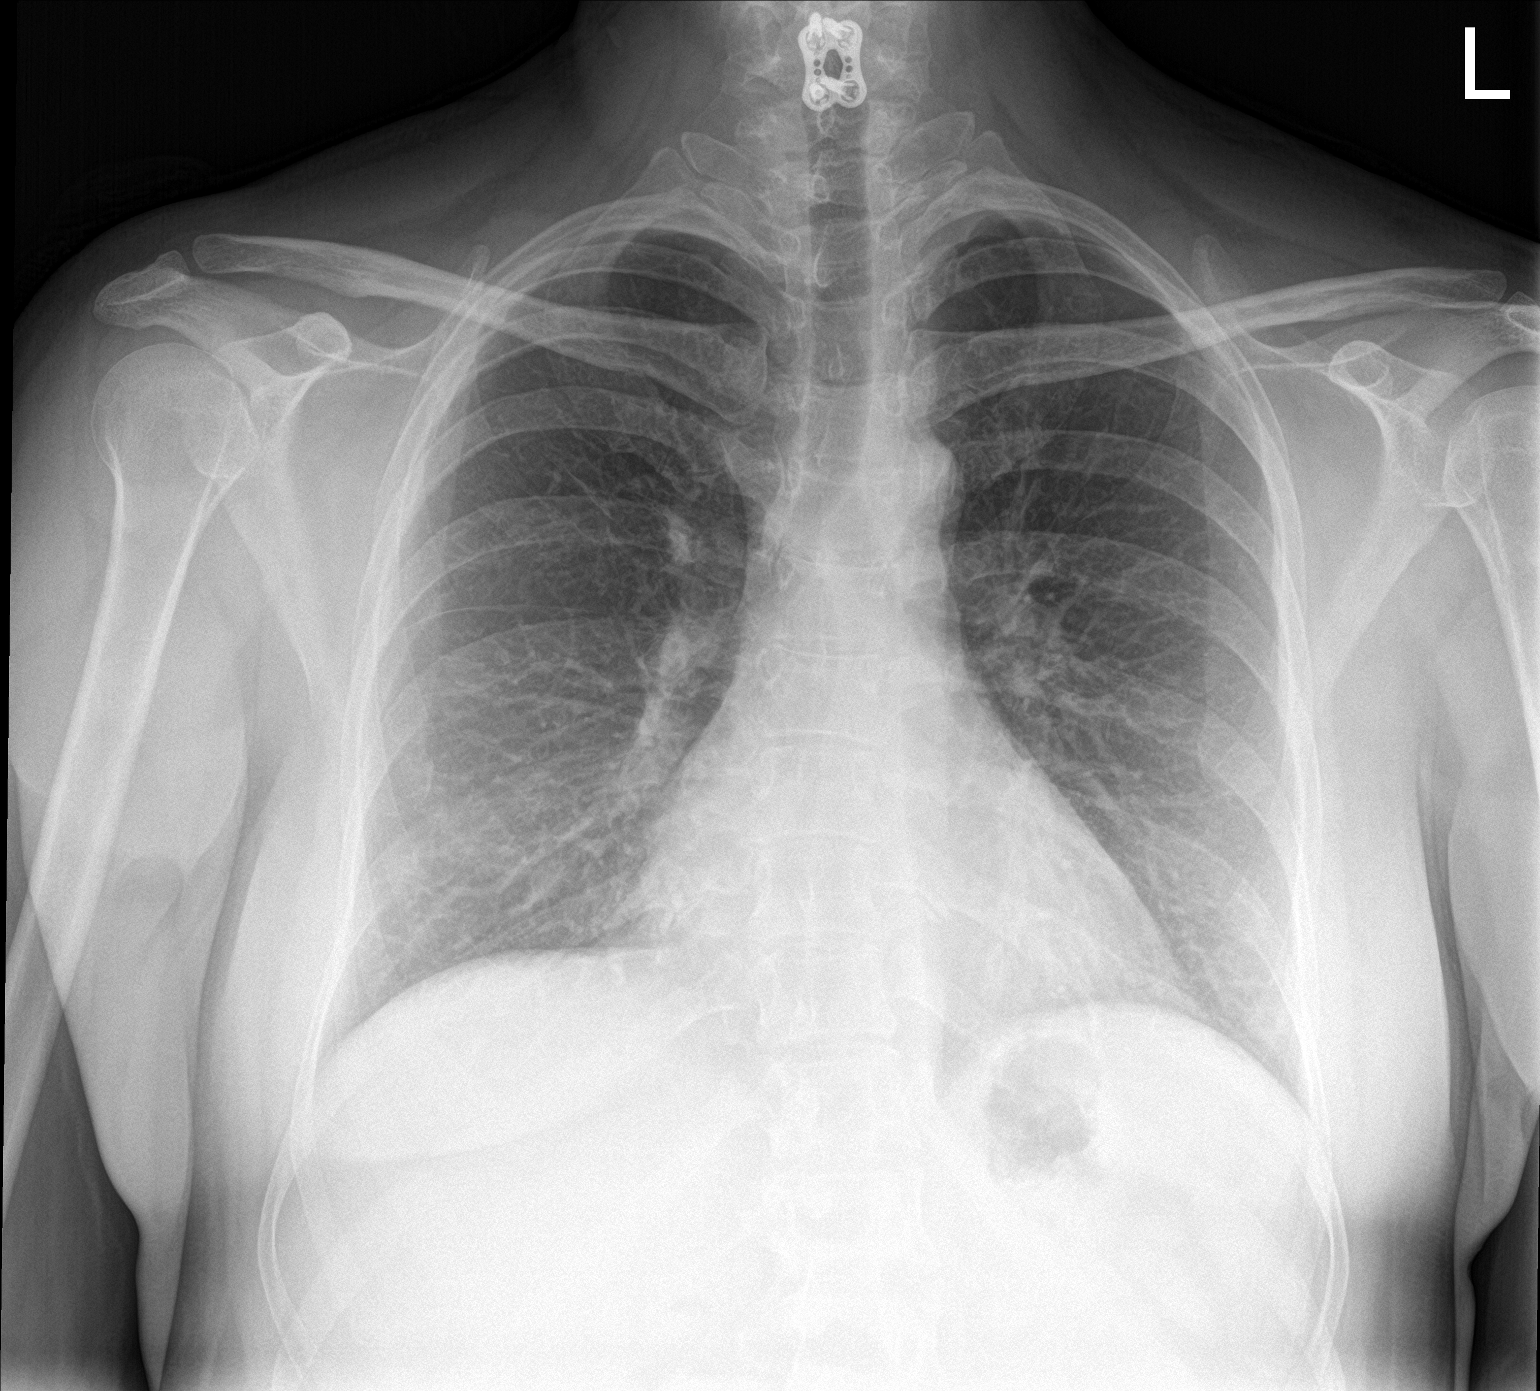

[chest lat]
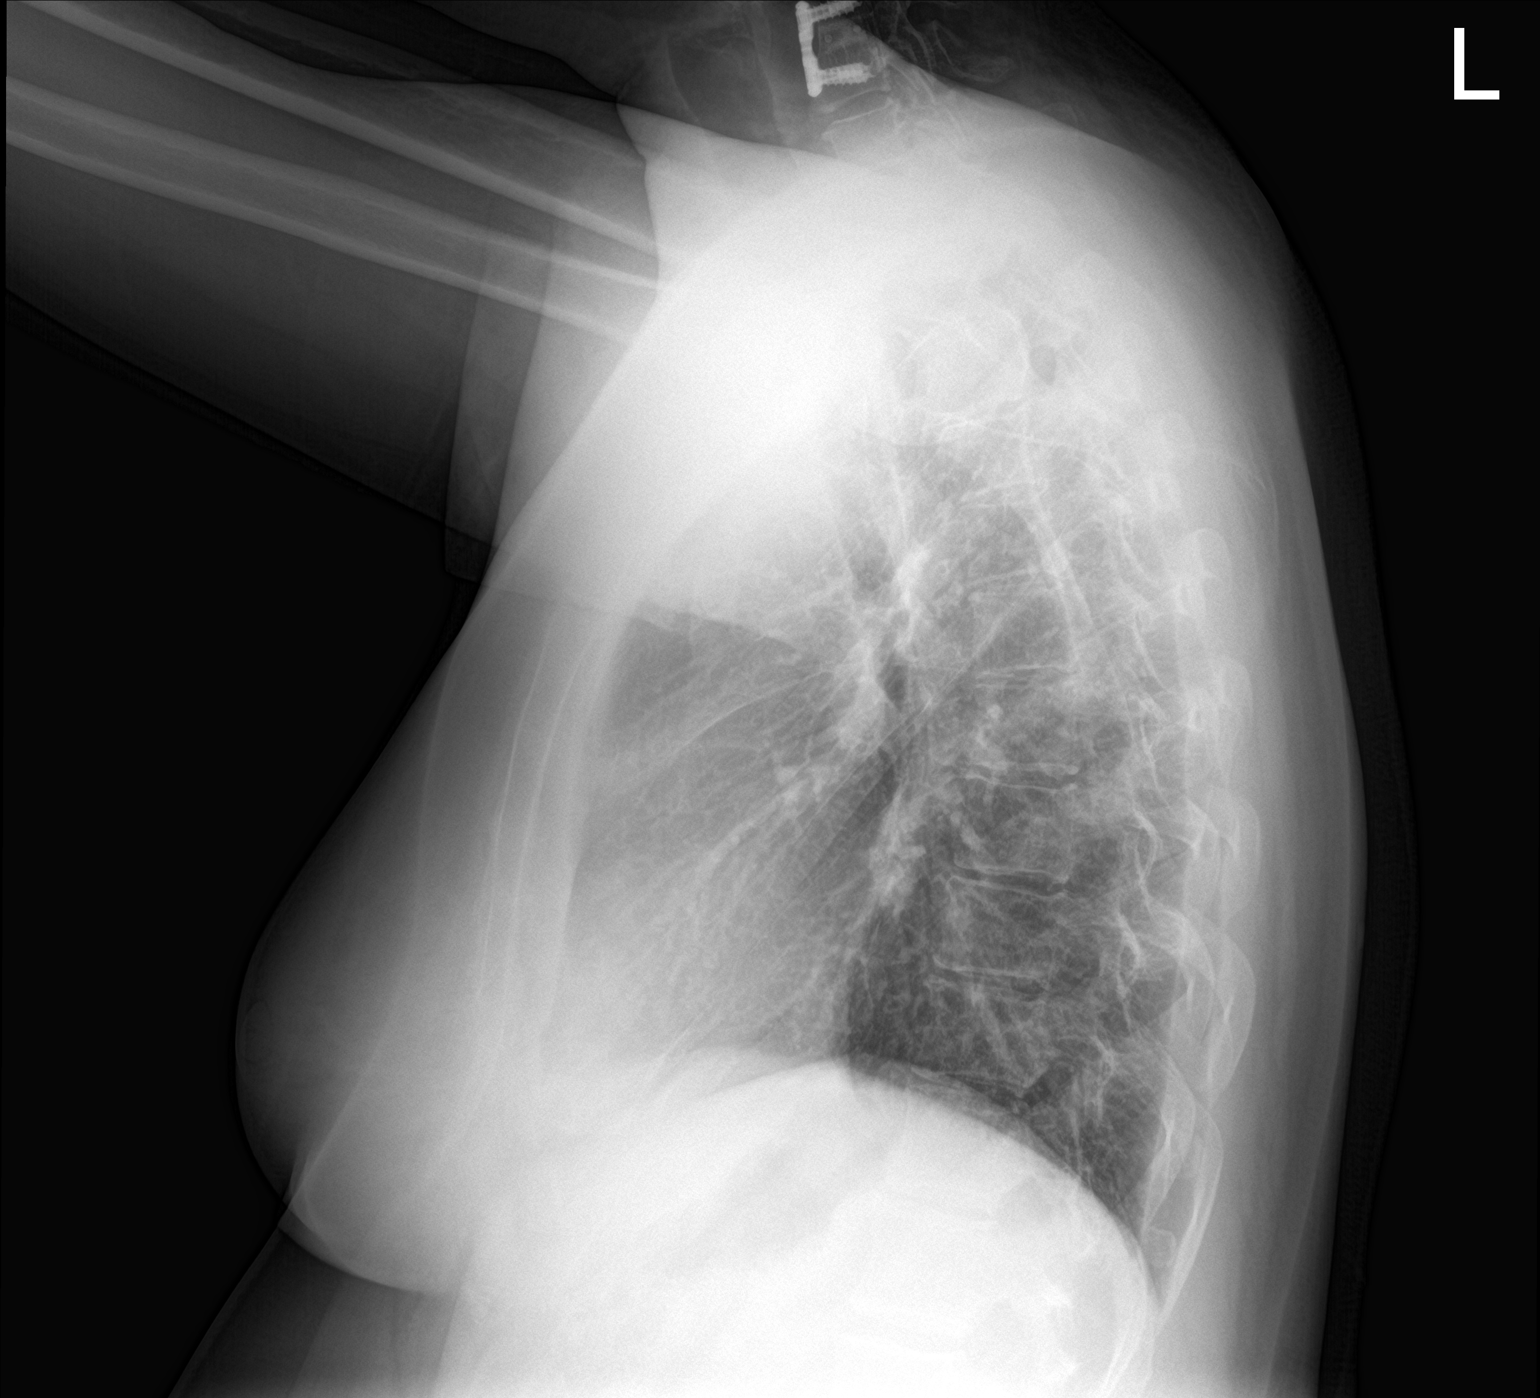

[2 of 2 positions shown; findings below may reference images not displayed]

FINDINGS: The mediastinal contours are within normal limits. No cardiomegaly.
The lungs are clear bilaterally without evidence of focal
consolidation, pleural effusion, or pneumothorax. No acute osseous
abnormality. Lower cervical fusion hardware in place.
IMPRESSION: No acute cardiopulmonary process.
